# Patient Record
Sex: Male | Born: 1935 | Race: White | Hispanic: No | Smoking: Former smoker
Health system: Southern US, Community
[De-identification: ages and names within clinical notes are randomized; demographics above are authoritative.]

## PROBLEM LIST (undated history)

## (undated) DIAGNOSIS — K219 Gastro-esophageal reflux disease without esophagitis: Secondary | ICD-10-CM

## (undated) DIAGNOSIS — I350 Nonrheumatic aortic (valve) stenosis: Secondary | ICD-10-CM

## (undated) DIAGNOSIS — G629 Polyneuropathy, unspecified: Secondary | ICD-10-CM

## (undated) DIAGNOSIS — E119 Type 2 diabetes mellitus without complications: Secondary | ICD-10-CM

## (undated) DIAGNOSIS — Z974 Presence of external hearing-aid: Secondary | ICD-10-CM

## (undated) DIAGNOSIS — I2510 Atherosclerotic heart disease of native coronary artery without angina pectoris: Secondary | ICD-10-CM

## (undated) DIAGNOSIS — I219 Acute myocardial infarction, unspecified: Secondary | ICD-10-CM

## (undated) DIAGNOSIS — I10 Essential (primary) hypertension: Secondary | ICD-10-CM

## (undated) DIAGNOSIS — I6529 Occlusion and stenosis of unspecified carotid artery: Secondary | ICD-10-CM

---

## 2005-05-05 ENCOUNTER — Ambulatory Visit: Payer: Self-pay | Admitting: Unknown Physician Specialty

## 2010-08-20 ENCOUNTER — Ambulatory Visit: Payer: Self-pay | Admitting: Unknown Physician Specialty

## 2010-12-14 ENCOUNTER — Ambulatory Visit: Payer: Self-pay | Admitting: Internal Medicine

## 2010-12-25 ENCOUNTER — Ambulatory Visit: Payer: Self-pay | Admitting: Internal Medicine

## 2011-01-13 ENCOUNTER — Ambulatory Visit: Payer: Self-pay | Admitting: Internal Medicine

## 2012-01-19 ENCOUNTER — Ambulatory Visit: Payer: Self-pay | Admitting: Internal Medicine

## 2012-01-20 LAB — BASIC METABOLIC PANEL
BUN: 13 mg/dL (ref 7–18)
Calcium, Total: 8.4 mg/dL — ABNORMAL LOW (ref 8.5–10.1)
Co2: 27 mmol/L (ref 21–32)
Creatinine: 0.92 mg/dL (ref 0.60–1.30)
EGFR (Non-African Amer.): >60
Glucose: 135 mg/dL — ABNORMAL HIGH (ref 65–99)
Potassium: 4.2 mmol/L (ref 3.5–5.1)
Sodium: 141 mmol/L (ref 136–145)

## 2012-01-20 LAB — CK TOTAL AND CKMB (NOT AT ARMC)
CK, Total: 35 U/L (ref 35–232)
CK-MB: 2.7 ng/mL (ref 0.5–3.6)

## 2014-06-09 DIAGNOSIS — G63 Polyneuropathy in diseases classified elsewhere: Secondary | ICD-10-CM | POA: Diagnosis not present

## 2014-06-09 DIAGNOSIS — I10 Essential (primary) hypertension: Secondary | ICD-10-CM | POA: Diagnosis not present

## 2014-06-09 DIAGNOSIS — N401 Enlarged prostate with lower urinary tract symptoms: Secondary | ICD-10-CM | POA: Diagnosis not present

## 2014-06-09 DIAGNOSIS — Z79899 Other long term (current) drug therapy: Secondary | ICD-10-CM | POA: Diagnosis not present

## 2014-06-09 DIAGNOSIS — E1142 Type 2 diabetes mellitus with diabetic polyneuropathy: Secondary | ICD-10-CM | POA: Diagnosis not present

## 2014-06-09 DIAGNOSIS — D61818 Other pancytopenia: Secondary | ICD-10-CM | POA: Diagnosis not present

## 2014-06-09 DIAGNOSIS — E780 Pure hypercholesterolemia: Secondary | ICD-10-CM | POA: Diagnosis not present

## 2014-12-07 DIAGNOSIS — D61818 Other pancytopenia: Secondary | ICD-10-CM | POA: Diagnosis not present

## 2014-12-07 DIAGNOSIS — E1140 Type 2 diabetes mellitus with diabetic neuropathy, unspecified: Secondary | ICD-10-CM | POA: Diagnosis not present

## 2014-12-07 DIAGNOSIS — Z79899 Other long term (current) drug therapy: Secondary | ICD-10-CM | POA: Diagnosis not present

## 2014-12-07 DIAGNOSIS — E780 Pure hypercholesterolemia: Secondary | ICD-10-CM | POA: Diagnosis not present

## 2015-02-07 DIAGNOSIS — Z23 Encounter for immunization: Secondary | ICD-10-CM | POA: Diagnosis not present

## 2015-03-16 DIAGNOSIS — E11319 Type 2 diabetes mellitus with unspecified diabetic retinopathy without macular edema: Secondary | ICD-10-CM | POA: Diagnosis not present

## 2015-06-11 DIAGNOSIS — K219 Gastro-esophageal reflux disease without esophagitis: Secondary | ICD-10-CM | POA: Diagnosis not present

## 2015-06-11 DIAGNOSIS — Z79899 Other long term (current) drug therapy: Secondary | ICD-10-CM | POA: Diagnosis not present

## 2015-06-11 DIAGNOSIS — R252 Cramp and spasm: Secondary | ICD-10-CM | POA: Diagnosis not present

## 2015-06-11 DIAGNOSIS — G63 Polyneuropathy in diseases classified elsewhere: Secondary | ICD-10-CM | POA: Diagnosis not present

## 2015-06-11 DIAGNOSIS — I10 Essential (primary) hypertension: Secondary | ICD-10-CM | POA: Diagnosis not present

## 2015-06-11 DIAGNOSIS — E1140 Type 2 diabetes mellitus with diabetic neuropathy, unspecified: Secondary | ICD-10-CM | POA: Diagnosis not present

## 2015-06-11 DIAGNOSIS — E7800 Pure hypercholesterolemia, unspecified: Secondary | ICD-10-CM | POA: Diagnosis not present

## 2015-06-11 DIAGNOSIS — D61818 Other pancytopenia: Secondary | ICD-10-CM | POA: Diagnosis not present

## 2015-08-13 DIAGNOSIS — E7800 Pure hypercholesterolemia, unspecified: Secondary | ICD-10-CM | POA: Diagnosis not present

## 2015-08-13 DIAGNOSIS — E1140 Type 2 diabetes mellitus with diabetic neuropathy, unspecified: Secondary | ICD-10-CM | POA: Diagnosis not present

## 2015-08-13 DIAGNOSIS — R252 Cramp and spasm: Secondary | ICD-10-CM | POA: Diagnosis not present

## 2015-08-13 DIAGNOSIS — N401 Enlarged prostate with lower urinary tract symptoms: Secondary | ICD-10-CM | POA: Diagnosis not present

## 2015-08-13 DIAGNOSIS — E559 Vitamin D deficiency, unspecified: Secondary | ICD-10-CM | POA: Diagnosis not present

## 2015-08-13 DIAGNOSIS — I10 Essential (primary) hypertension: Secondary | ICD-10-CM | POA: Diagnosis not present

## 2015-12-07 DIAGNOSIS — G63 Polyneuropathy in diseases classified elsewhere: Secondary | ICD-10-CM | POA: Diagnosis not present

## 2015-12-07 DIAGNOSIS — E1140 Type 2 diabetes mellitus with diabetic neuropathy, unspecified: Secondary | ICD-10-CM | POA: Diagnosis not present

## 2015-12-07 DIAGNOSIS — K219 Gastro-esophageal reflux disease without esophagitis: Secondary | ICD-10-CM | POA: Diagnosis not present

## 2015-12-07 DIAGNOSIS — E7800 Pure hypercholesterolemia, unspecified: Secondary | ICD-10-CM | POA: Diagnosis not present

## 2015-12-07 DIAGNOSIS — M791 Myalgia: Secondary | ICD-10-CM | POA: Diagnosis not present

## 2015-12-07 DIAGNOSIS — I10 Essential (primary) hypertension: Secondary | ICD-10-CM | POA: Diagnosis not present

## 2015-12-07 DIAGNOSIS — D61818 Other pancytopenia: Secondary | ICD-10-CM | POA: Diagnosis not present

## 2015-12-07 DIAGNOSIS — E559 Vitamin D deficiency, unspecified: Secondary | ICD-10-CM | POA: Diagnosis not present

## 2015-12-07 DIAGNOSIS — Z79899 Other long term (current) drug therapy: Secondary | ICD-10-CM | POA: Diagnosis not present

## 2016-01-14 DIAGNOSIS — Z23 Encounter for immunization: Secondary | ICD-10-CM | POA: Diagnosis not present

## 2016-06-10 DIAGNOSIS — I10 Essential (primary) hypertension: Secondary | ICD-10-CM | POA: Diagnosis not present

## 2016-06-10 DIAGNOSIS — I2510 Atherosclerotic heart disease of native coronary artery without angina pectoris: Secondary | ICD-10-CM | POA: Diagnosis not present

## 2016-06-10 DIAGNOSIS — E1140 Type 2 diabetes mellitus with diabetic neuropathy, unspecified: Secondary | ICD-10-CM | POA: Diagnosis not present

## 2016-06-10 DIAGNOSIS — Z79899 Other long term (current) drug therapy: Secondary | ICD-10-CM | POA: Diagnosis not present

## 2016-06-10 DIAGNOSIS — Z23 Encounter for immunization: Secondary | ICD-10-CM | POA: Diagnosis not present

## 2016-06-10 DIAGNOSIS — K219 Gastro-esophageal reflux disease without esophagitis: Secondary | ICD-10-CM | POA: Diagnosis not present

## 2016-06-10 DIAGNOSIS — E7800 Pure hypercholesterolemia, unspecified: Secondary | ICD-10-CM | POA: Diagnosis not present

## 2016-06-10 DIAGNOSIS — D61818 Other pancytopenia: Secondary | ICD-10-CM | POA: Diagnosis not present

## 2016-07-18 DIAGNOSIS — E119 Type 2 diabetes mellitus without complications: Secondary | ICD-10-CM | POA: Diagnosis not present

## 2016-12-08 DIAGNOSIS — Z79899 Other long term (current) drug therapy: Secondary | ICD-10-CM | POA: Diagnosis not present

## 2016-12-08 DIAGNOSIS — D61818 Other pancytopenia: Secondary | ICD-10-CM | POA: Diagnosis not present

## 2016-12-08 DIAGNOSIS — E7800 Pure hypercholesterolemia, unspecified: Secondary | ICD-10-CM | POA: Diagnosis not present

## 2016-12-08 DIAGNOSIS — N401 Enlarged prostate with lower urinary tract symptoms: Secondary | ICD-10-CM | POA: Diagnosis not present

## 2016-12-08 DIAGNOSIS — E559 Vitamin D deficiency, unspecified: Secondary | ICD-10-CM | POA: Diagnosis not present

## 2016-12-08 DIAGNOSIS — E538 Deficiency of other specified B group vitamins: Secondary | ICD-10-CM | POA: Diagnosis not present

## 2016-12-08 DIAGNOSIS — E1140 Type 2 diabetes mellitus with diabetic neuropathy, unspecified: Secondary | ICD-10-CM | POA: Diagnosis not present

## 2016-12-08 DIAGNOSIS — K219 Gastro-esophageal reflux disease without esophagitis: Secondary | ICD-10-CM | POA: Diagnosis not present

## 2016-12-08 DIAGNOSIS — G63 Polyneuropathy in diseases classified elsewhere: Secondary | ICD-10-CM | POA: Diagnosis not present

## 2017-01-28 DIAGNOSIS — B350 Tinea barbae and tinea capitis: Secondary | ICD-10-CM | POA: Diagnosis not present

## 2017-06-10 DIAGNOSIS — E1140 Type 2 diabetes mellitus with diabetic neuropathy, unspecified: Secondary | ICD-10-CM | POA: Diagnosis not present

## 2017-06-10 DIAGNOSIS — E559 Vitamin D deficiency, unspecified: Secondary | ICD-10-CM | POA: Diagnosis not present

## 2017-06-10 DIAGNOSIS — E7800 Pure hypercholesterolemia, unspecified: Secondary | ICD-10-CM | POA: Diagnosis not present

## 2017-06-10 DIAGNOSIS — I25119 Atherosclerotic heart disease of native coronary artery with unspecified angina pectoris: Secondary | ICD-10-CM | POA: Diagnosis not present

## 2017-06-10 DIAGNOSIS — E538 Deficiency of other specified B group vitamins: Secondary | ICD-10-CM | POA: Diagnosis not present

## 2017-06-10 DIAGNOSIS — Z79899 Other long term (current) drug therapy: Secondary | ICD-10-CM | POA: Diagnosis not present

## 2017-06-10 DIAGNOSIS — Z0000 Encounter for general adult medical examination without abnormal findings: Secondary | ICD-10-CM | POA: Diagnosis not present

## 2017-06-10 DIAGNOSIS — I10 Essential (primary) hypertension: Secondary | ICD-10-CM | POA: Diagnosis not present

## 2017-06-10 DIAGNOSIS — D61818 Other pancytopenia: Secondary | ICD-10-CM | POA: Diagnosis not present

## 2017-06-16 DIAGNOSIS — Z7689 Persons encountering health services in other specified circumstances: Secondary | ICD-10-CM | POA: Diagnosis not present

## 2017-06-16 DIAGNOSIS — E7800 Pure hypercholesterolemia, unspecified: Secondary | ICD-10-CM | POA: Diagnosis not present

## 2017-06-16 DIAGNOSIS — I10 Essential (primary) hypertension: Secondary | ICD-10-CM | POA: Diagnosis not present

## 2017-06-16 DIAGNOSIS — I25119 Atherosclerotic heart disease of native coronary artery with unspecified angina pectoris: Secondary | ICD-10-CM | POA: Diagnosis not present

## 2017-06-22 DIAGNOSIS — I208 Other forms of angina pectoris: Secondary | ICD-10-CM | POA: Diagnosis present

## 2017-06-25 ENCOUNTER — Encounter: Payer: Self-pay | Admitting: Emergency Medicine

## 2017-06-25 ENCOUNTER — Encounter
Admission: RE | Disposition: A | Discharge status: Home / Self Care | Payer: Self-pay | Source: Ambulatory Visit | Attending: Internal Medicine

## 2017-06-25 ENCOUNTER — Ambulatory Visit
Admission: RE | Admit: 2017-06-25 | Discharge: 2017-06-25 | Disposition: A | Discharge status: Home / Self Care | Payer: Medicare HMO | Source: Ambulatory Visit | Attending: Internal Medicine | Admitting: Internal Medicine

## 2017-06-25 DIAGNOSIS — Z87891 Personal history of nicotine dependence: Secondary | ICD-10-CM | POA: Diagnosis not present

## 2017-06-25 DIAGNOSIS — Z7984 Long term (current) use of oral hypoglycemic drugs: Secondary | ICD-10-CM | POA: Diagnosis not present

## 2017-06-25 DIAGNOSIS — Z7982 Long term (current) use of aspirin: Secondary | ICD-10-CM | POA: Diagnosis not present

## 2017-06-25 DIAGNOSIS — R079 Chest pain, unspecified: Secondary | ICD-10-CM | POA: Diagnosis present

## 2017-06-25 DIAGNOSIS — I208 Other forms of angina pectoris: Secondary | ICD-10-CM | POA: Diagnosis present

## 2017-06-25 DIAGNOSIS — Z955 Presence of coronary angioplasty implant and graft: Secondary | ICD-10-CM | POA: Diagnosis not present

## 2017-06-25 DIAGNOSIS — I25110 Atherosclerotic heart disease of native coronary artery with unstable angina pectoris: Secondary | ICD-10-CM | POA: Diagnosis not present

## 2017-06-25 DIAGNOSIS — I25119 Atherosclerotic heart disease of native coronary artery with unspecified angina pectoris: Secondary | ICD-10-CM | POA: Diagnosis not present

## 2017-06-25 DIAGNOSIS — I252 Old myocardial infarction: Secondary | ICD-10-CM | POA: Diagnosis not present

## 2017-06-25 DIAGNOSIS — I10 Essential (primary) hypertension: Secondary | ICD-10-CM | POA: Insufficient documentation

## 2017-06-25 DIAGNOSIS — K219 Gastro-esophageal reflux disease without esophagitis: Secondary | ICD-10-CM | POA: Insufficient documentation

## 2017-06-25 DIAGNOSIS — E1142 Type 2 diabetes mellitus with diabetic polyneuropathy: Secondary | ICD-10-CM | POA: Diagnosis not present

## 2017-06-25 DIAGNOSIS — E7800 Pure hypercholesterolemia, unspecified: Secondary | ICD-10-CM | POA: Diagnosis not present

## 2017-06-25 HISTORY — DX: Gastro-esophageal reflux disease without esophagitis: K21.9

## 2017-06-25 HISTORY — PX: LEFT HEART CATH AND CORONARY ANGIOGRAPHY: CATH118249

## 2017-06-25 HISTORY — DX: Acute myocardial infarction, unspecified: I21.9

## 2017-06-25 HISTORY — DX: Essential (primary) hypertension: I10

## 2017-06-25 HISTORY — DX: Type 2 diabetes mellitus without complications: E11.9

## 2017-06-25 HISTORY — DX: Atherosclerotic heart disease of native coronary artery without angina pectoris: I25.10

## 2017-06-25 LAB — BASIC METABOLIC PANEL
ANION GAP: 5 (ref 5–15)
BUN: 17 mg/dL (ref 6–20)
CHLORIDE: 107 mmol/L (ref 101–111)
CO2: 27 mmol/L (ref 22–32)
Calcium: 8.7 mg/dL — ABNORMAL LOW (ref 8.9–10.3)
Creatinine, Ser: 1 mg/dL (ref 0.61–1.24)
GFR calc Af Amer: >60 mL/min (ref >60)
GFR calc non Af Amer: >60 mL/min (ref >60)
GLUCOSE: 197 mg/dL — AB (ref 65–99)
POTASSIUM: 4.5 mmol/L (ref 3.5–5.1)
Sodium: 139 mmol/L (ref 135–145)

## 2017-06-25 LAB — GLUCOSE, CAPILLARY
GLUCOSE-CAPILLARY: 177 mg/dL — AB (ref 65–99)
Glucose-Capillary: 172 mg/dL — ABNORMAL HIGH (ref 65–99)

## 2017-06-25 LAB — CBC
HCT: 34.8 % — ABNORMAL LOW (ref 40.0–52.0)
HEMOGLOBIN: 12.1 g/dL — AB (ref 13.0–18.0)
MCH: 30.2 pg (ref 26.0–34.0)
MCHC: 34.8 g/dL (ref 32.0–36.0)
MCV: 86.7 fL (ref 80.0–100.0)
PLATELETS: 165 10*3/uL (ref 150–440)
RBC: 4.01 MIL/uL — AB (ref 4.40–5.90)
RDW: 14.2 % (ref 11.5–14.5)
WBC: 2.8 10*3/uL — AB (ref 3.8–10.6)

## 2017-06-25 SURGERY — LEFT HEART CATH AND CORONARY ANGIOGRAPHY
Anesthesia: Moderate Sedation | Laterality: Left

## 2017-06-25 MED ORDER — ACETAMINOPHEN 325 MG PO TABS: 650.0000 mg | ORAL_TABLET | ORAL | Status: DC | PRN

## 2017-06-25 MED ORDER — SODIUM CHLORIDE 0.9 % WEIGHT BASED INFUSION: 1.0000 mL/kg/h | INTRAVENOUS | Status: DC

## 2017-06-25 MED ORDER — SODIUM CHLORIDE 0.9 % WEIGHT BASED INFUSION
251.7000 mL/h | INTRAVENOUS | Status: AC
  Administered 2017-06-25: 3 mL/kg/h via INTRAVENOUS

## 2017-06-25 MED ORDER — MIDAZOLAM HCL 2 MG/2ML IJ SOLN
INTRAMUSCULAR | Status: AC
  Filled 2017-06-25: qty 2

## 2017-06-25 MED ORDER — MIDAZOLAM HCL 2 MG/2ML IJ SOLN
INTRAMUSCULAR | Status: DC | PRN
  Administered 2017-06-25: 1 mg via INTRAVENOUS

## 2017-06-25 MED ORDER — SODIUM CHLORIDE 0.9% FLUSH: 3.0000 mL | INTRAVENOUS | Status: DC | PRN

## 2017-06-25 MED ORDER — SODIUM CHLORIDE 0.9 % IV SOLN: 250.0000 mL | INTRAVENOUS | Status: DC | PRN

## 2017-06-25 MED ORDER — ONDANSETRON HCL 4 MG/2ML IJ SOLN: 4.0000 mg | Freq: Four times a day (QID) | INTRAMUSCULAR | Status: DC | PRN

## 2017-06-25 MED ORDER — ASPIRIN 81 MG PO CHEW
81.0000 mg | CHEWABLE_TABLET | ORAL | Status: AC
  Administered 2017-06-25: 81 mg via ORAL

## 2017-06-25 MED ORDER — ASPIRIN 81 MG PO CHEW
CHEWABLE_TABLET | ORAL | Status: AC
  Filled 2017-06-25: qty 1

## 2017-06-25 MED ORDER — IOPAMIDOL (ISOVUE-300) INJECTION 61%
INTRAVENOUS | Status: DC | PRN
  Administered 2017-06-25: 95 mL via INTRA_ARTERIAL

## 2017-06-25 MED ORDER — FENTANYL CITRATE (PF) 100 MCG/2ML IJ SOLN
INTRAMUSCULAR | Status: AC
  Filled 2017-06-25: qty 2

## 2017-06-25 MED ORDER — FENTANYL CITRATE (PF) 100 MCG/2ML IJ SOLN
INTRAMUSCULAR | Status: DC | PRN
  Administered 2017-06-25: 25 ug via INTRAVENOUS

## 2017-06-25 MED ORDER — SODIUM CHLORIDE 0.9% FLUSH: 3.0000 mL | Freq: Two times a day (BID) | INTRAVENOUS | Status: DC

## 2017-06-25 MED ORDER — HEPARIN (PORCINE) IN NACL 2-0.9 UNIT/ML-% IJ SOLN
INTRAMUSCULAR | Status: AC
  Filled 2017-06-25: qty 500

## 2017-06-25 SURGICAL SUPPLY — 9 items
CATH INFINITI 5FR ANG PIGTAIL (CATHETERS) ×3 IMPLANT
CATH INFINITI 5FR JL4 (CATHETERS) ×3 IMPLANT
CATH INFINITI JR4 5F (CATHETERS) ×3 IMPLANT
DEVICE CLOSURE MYNXGRIP 5F (Vascular Products) ×3 IMPLANT
KIT MANI 3VAL PERCEP (MISCELLANEOUS) ×3 IMPLANT
NEEDLE PERC 18GX7CM (NEEDLE) ×3 IMPLANT
PACK CARDIAC CATH (CUSTOM PROCEDURE TRAY) ×3 IMPLANT
SHEATH PINNACLE 5F 10CM (SHEATH) ×3 IMPLANT
WIRE GUIDERIGHT .035X150 (WIRE) ×3 IMPLANT

## 2017-06-29 DIAGNOSIS — E1140 Type 2 diabetes mellitus with diabetic neuropathy, unspecified: Secondary | ICD-10-CM | POA: Diagnosis not present

## 2017-07-14 DIAGNOSIS — I10 Essential (primary) hypertension: Secondary | ICD-10-CM | POA: Diagnosis not present

## 2017-07-14 DIAGNOSIS — I25119 Atherosclerotic heart disease of native coronary artery with unspecified angina pectoris: Secondary | ICD-10-CM | POA: Diagnosis not present

## 2017-07-14 DIAGNOSIS — E7800 Pure hypercholesterolemia, unspecified: Secondary | ICD-10-CM | POA: Diagnosis not present

## 2017-08-20 DIAGNOSIS — E1140 Type 2 diabetes mellitus with diabetic neuropathy, unspecified: Secondary | ICD-10-CM | POA: Diagnosis not present

## 2017-11-09 DIAGNOSIS — Z79899 Other long term (current) drug therapy: Secondary | ICD-10-CM | POA: Diagnosis not present

## 2017-11-09 DIAGNOSIS — E559 Vitamin D deficiency, unspecified: Secondary | ICD-10-CM | POA: Diagnosis not present

## 2017-11-09 DIAGNOSIS — I10 Essential (primary) hypertension: Secondary | ICD-10-CM | POA: Diagnosis not present

## 2017-11-09 DIAGNOSIS — D61818 Other pancytopenia: Secondary | ICD-10-CM | POA: Diagnosis not present

## 2017-11-09 DIAGNOSIS — E7800 Pure hypercholesterolemia, unspecified: Secondary | ICD-10-CM | POA: Diagnosis not present

## 2017-11-09 DIAGNOSIS — N401 Enlarged prostate with lower urinary tract symptoms: Secondary | ICD-10-CM | POA: Diagnosis not present

## 2017-11-09 DIAGNOSIS — E538 Deficiency of other specified B group vitamins: Secondary | ICD-10-CM | POA: Diagnosis not present

## 2017-11-09 DIAGNOSIS — E1140 Type 2 diabetes mellitus with diabetic neuropathy, unspecified: Secondary | ICD-10-CM | POA: Diagnosis not present

## 2017-11-09 DIAGNOSIS — I25119 Atherosclerotic heart disease of native coronary artery with unspecified angina pectoris: Secondary | ICD-10-CM | POA: Diagnosis not present

## 2017-11-18 DIAGNOSIS — R0989 Other specified symptoms and signs involving the circulatory and respiratory systems: Secondary | ICD-10-CM | POA: Diagnosis not present

## 2017-11-18 DIAGNOSIS — E1140 Type 2 diabetes mellitus with diabetic neuropathy, unspecified: Secondary | ICD-10-CM | POA: Diagnosis not present

## 2017-11-18 DIAGNOSIS — I10 Essential (primary) hypertension: Secondary | ICD-10-CM | POA: Diagnosis not present

## 2017-11-18 DIAGNOSIS — K219 Gastro-esophageal reflux disease without esophagitis: Secondary | ICD-10-CM | POA: Diagnosis not present

## 2017-11-18 DIAGNOSIS — I25119 Atherosclerotic heart disease of native coronary artery with unspecified angina pectoris: Secondary | ICD-10-CM | POA: Diagnosis not present

## 2017-11-18 DIAGNOSIS — I38 Endocarditis, valve unspecified: Secondary | ICD-10-CM | POA: Diagnosis not present

## 2017-11-18 DIAGNOSIS — R0609 Other forms of dyspnea: Secondary | ICD-10-CM | POA: Diagnosis not present

## 2017-11-18 DIAGNOSIS — E7800 Pure hypercholesterolemia, unspecified: Secondary | ICD-10-CM | POA: Diagnosis not present

## 2017-12-08 DIAGNOSIS — R0609 Other forms of dyspnea: Secondary | ICD-10-CM | POA: Diagnosis not present

## 2017-12-08 DIAGNOSIS — I25118 Atherosclerotic heart disease of native coronary artery with other forms of angina pectoris: Secondary | ICD-10-CM | POA: Diagnosis not present

## 2017-12-08 DIAGNOSIS — E1140 Type 2 diabetes mellitus with diabetic neuropathy, unspecified: Secondary | ICD-10-CM | POA: Diagnosis not present

## 2017-12-08 DIAGNOSIS — I25119 Atherosclerotic heart disease of native coronary artery with unspecified angina pectoris: Secondary | ICD-10-CM | POA: Diagnosis not present

## 2017-12-08 DIAGNOSIS — K219 Gastro-esophageal reflux disease without esophagitis: Secondary | ICD-10-CM | POA: Diagnosis not present

## 2017-12-08 DIAGNOSIS — I38 Endocarditis, valve unspecified: Secondary | ICD-10-CM | POA: Diagnosis not present

## 2017-12-08 DIAGNOSIS — I10 Essential (primary) hypertension: Secondary | ICD-10-CM | POA: Diagnosis not present

## 2017-12-08 DIAGNOSIS — I6523 Occlusion and stenosis of bilateral carotid arteries: Secondary | ICD-10-CM | POA: Diagnosis not present

## 2018-01-01 DIAGNOSIS — E11319 Type 2 diabetes mellitus with unspecified diabetic retinopathy without macular edema: Secondary | ICD-10-CM | POA: Diagnosis not present

## 2018-02-09 DIAGNOSIS — I350 Nonrheumatic aortic (valve) stenosis: Secondary | ICD-10-CM | POA: Diagnosis not present

## 2018-02-09 DIAGNOSIS — I6523 Occlusion and stenosis of bilateral carotid arteries: Secondary | ICD-10-CM | POA: Diagnosis not present

## 2018-02-09 DIAGNOSIS — I10 Essential (primary) hypertension: Secondary | ICD-10-CM | POA: Diagnosis not present

## 2018-02-09 DIAGNOSIS — R001 Bradycardia, unspecified: Secondary | ICD-10-CM | POA: Diagnosis not present

## 2018-02-09 DIAGNOSIS — I25119 Atherosclerotic heart disease of native coronary artery with unspecified angina pectoris: Secondary | ICD-10-CM | POA: Diagnosis not present

## 2018-05-19 DIAGNOSIS — I25119 Atherosclerotic heart disease of native coronary artery with unspecified angina pectoris: Secondary | ICD-10-CM | POA: Diagnosis not present

## 2018-05-19 DIAGNOSIS — E7800 Pure hypercholesterolemia, unspecified: Secondary | ICD-10-CM | POA: Diagnosis not present

## 2018-05-19 DIAGNOSIS — E559 Vitamin D deficiency, unspecified: Secondary | ICD-10-CM | POA: Diagnosis not present

## 2018-05-19 DIAGNOSIS — Z79899 Other long term (current) drug therapy: Secondary | ICD-10-CM | POA: Diagnosis not present

## 2018-05-19 DIAGNOSIS — D61818 Other pancytopenia: Secondary | ICD-10-CM | POA: Diagnosis not present

## 2018-05-19 DIAGNOSIS — E1159 Type 2 diabetes mellitus with other circulatory complications: Secondary | ICD-10-CM | POA: Diagnosis not present

## 2018-05-19 DIAGNOSIS — E1140 Type 2 diabetes mellitus with diabetic neuropathy, unspecified: Secondary | ICD-10-CM | POA: Diagnosis not present

## 2018-05-19 DIAGNOSIS — N401 Enlarged prostate with lower urinary tract symptoms: Secondary | ICD-10-CM | POA: Diagnosis not present

## 2018-08-12 DIAGNOSIS — I10 Essential (primary) hypertension: Secondary | ICD-10-CM | POA: Diagnosis not present

## 2018-08-12 DIAGNOSIS — I350 Nonrheumatic aortic (valve) stenosis: Secondary | ICD-10-CM | POA: Diagnosis not present

## 2018-08-12 DIAGNOSIS — I6523 Occlusion and stenosis of bilateral carotid arteries: Secondary | ICD-10-CM | POA: Diagnosis not present

## 2018-08-12 DIAGNOSIS — E7800 Pure hypercholesterolemia, unspecified: Secondary | ICD-10-CM | POA: Diagnosis not present

## 2018-08-12 DIAGNOSIS — I25119 Atherosclerotic heart disease of native coronary artery with unspecified angina pectoris: Secondary | ICD-10-CM | POA: Diagnosis not present

## 2018-08-12 DIAGNOSIS — E1159 Type 2 diabetes mellitus with other circulatory complications: Secondary | ICD-10-CM | POA: Diagnosis not present

## 2018-11-10 DIAGNOSIS — E7800 Pure hypercholesterolemia, unspecified: Secondary | ICD-10-CM | POA: Diagnosis not present

## 2018-11-10 DIAGNOSIS — E559 Vitamin D deficiency, unspecified: Secondary | ICD-10-CM | POA: Diagnosis not present

## 2018-11-10 DIAGNOSIS — E1159 Type 2 diabetes mellitus with other circulatory complications: Secondary | ICD-10-CM | POA: Diagnosis not present

## 2018-11-10 DIAGNOSIS — Z0000 Encounter for general adult medical examination without abnormal findings: Secondary | ICD-10-CM | POA: Diagnosis not present

## 2018-11-10 DIAGNOSIS — E538 Deficiency of other specified B group vitamins: Secondary | ICD-10-CM | POA: Diagnosis not present

## 2018-11-10 DIAGNOSIS — Z79899 Other long term (current) drug therapy: Secondary | ICD-10-CM | POA: Diagnosis not present

## 2018-11-10 DIAGNOSIS — E1140 Type 2 diabetes mellitus with diabetic neuropathy, unspecified: Secondary | ICD-10-CM | POA: Diagnosis not present

## 2018-11-10 DIAGNOSIS — D61818 Other pancytopenia: Secondary | ICD-10-CM | POA: Diagnosis not present

## 2018-11-10 DIAGNOSIS — N401 Enlarged prostate with lower urinary tract symptoms: Secondary | ICD-10-CM | POA: Diagnosis not present

## 2018-12-31 DIAGNOSIS — E11319 Type 2 diabetes mellitus with unspecified diabetic retinopathy without macular edema: Secondary | ICD-10-CM | POA: Diagnosis not present

## 2019-01-25 DIAGNOSIS — I350 Nonrheumatic aortic (valve) stenosis: Secondary | ICD-10-CM | POA: Diagnosis not present

## 2019-01-25 DIAGNOSIS — I6523 Occlusion and stenosis of bilateral carotid arteries: Secondary | ICD-10-CM | POA: Diagnosis not present

## 2019-01-25 DIAGNOSIS — R0600 Dyspnea, unspecified: Secondary | ICD-10-CM | POA: Diagnosis not present

## 2019-01-25 DIAGNOSIS — I25119 Atherosclerotic heart disease of native coronary artery with unspecified angina pectoris: Secondary | ICD-10-CM | POA: Diagnosis not present

## 2019-01-25 DIAGNOSIS — I10 Essential (primary) hypertension: Secondary | ICD-10-CM | POA: Diagnosis not present

## 2019-01-25 DIAGNOSIS — E1159 Type 2 diabetes mellitus with other circulatory complications: Secondary | ICD-10-CM | POA: Diagnosis not present

## 2019-01-25 DIAGNOSIS — R079 Chest pain, unspecified: Secondary | ICD-10-CM | POA: Diagnosis not present

## 2019-02-10 DIAGNOSIS — I25119 Atherosclerotic heart disease of native coronary artery with unspecified angina pectoris: Secondary | ICD-10-CM | POA: Diagnosis not present

## 2019-02-10 DIAGNOSIS — R0600 Dyspnea, unspecified: Secondary | ICD-10-CM | POA: Diagnosis not present

## 2019-02-10 DIAGNOSIS — I350 Nonrheumatic aortic (valve) stenosis: Secondary | ICD-10-CM | POA: Diagnosis not present

## 2019-02-10 DIAGNOSIS — E7800 Pure hypercholesterolemia, unspecified: Secondary | ICD-10-CM | POA: Diagnosis not present

## 2019-02-10 DIAGNOSIS — I6523 Occlusion and stenosis of bilateral carotid arteries: Secondary | ICD-10-CM | POA: Diagnosis not present

## 2019-02-10 DIAGNOSIS — R079 Chest pain, unspecified: Secondary | ICD-10-CM | POA: Diagnosis not present

## 2019-02-10 DIAGNOSIS — I441 Atrioventricular block, second degree: Secondary | ICD-10-CM | POA: Diagnosis not present

## 2019-02-22 DIAGNOSIS — H6011 Cellulitis of right external ear: Secondary | ICD-10-CM | POA: Diagnosis not present

## 2019-02-22 DIAGNOSIS — H6123 Impacted cerumen, bilateral: Secondary | ICD-10-CM | POA: Diagnosis not present

## 2019-03-07 DIAGNOSIS — H6122 Impacted cerumen, left ear: Secondary | ICD-10-CM | POA: Diagnosis not present

## 2019-03-07 DIAGNOSIS — H6010 Cellulitis of external ear, unspecified ear: Secondary | ICD-10-CM | POA: Diagnosis not present

## 2019-03-07 DIAGNOSIS — H903 Sensorineural hearing loss, bilateral: Secondary | ICD-10-CM | POA: Diagnosis not present

## 2019-03-09 DIAGNOSIS — I441 Atrioventricular block, second degree: Secondary | ICD-10-CM | POA: Diagnosis not present

## 2019-03-09 DIAGNOSIS — I350 Nonrheumatic aortic (valve) stenosis: Secondary | ICD-10-CM | POA: Diagnosis not present

## 2019-03-09 DIAGNOSIS — I25119 Atherosclerotic heart disease of native coronary artery with unspecified angina pectoris: Secondary | ICD-10-CM | POA: Diagnosis not present

## 2019-03-09 DIAGNOSIS — I6523 Occlusion and stenosis of bilateral carotid arteries: Secondary | ICD-10-CM | POA: Diagnosis not present

## 2019-05-18 DIAGNOSIS — Z79899 Other long term (current) drug therapy: Secondary | ICD-10-CM | POA: Diagnosis not present

## 2019-05-18 DIAGNOSIS — E1142 Type 2 diabetes mellitus with diabetic polyneuropathy: Secondary | ICD-10-CM | POA: Diagnosis not present

## 2019-05-18 DIAGNOSIS — D61818 Other pancytopenia: Secondary | ICD-10-CM | POA: Diagnosis not present

## 2019-05-18 DIAGNOSIS — I25118 Atherosclerotic heart disease of native coronary artery with other forms of angina pectoris: Secondary | ICD-10-CM | POA: Diagnosis not present

## 2019-05-18 DIAGNOSIS — E1140 Type 2 diabetes mellitus with diabetic neuropathy, unspecified: Secondary | ICD-10-CM | POA: Diagnosis not present

## 2019-05-18 DIAGNOSIS — K219 Gastro-esophageal reflux disease without esophagitis: Secondary | ICD-10-CM | POA: Diagnosis not present

## 2019-05-18 DIAGNOSIS — I10 Essential (primary) hypertension: Secondary | ICD-10-CM | POA: Diagnosis not present

## 2019-05-18 DIAGNOSIS — E1159 Type 2 diabetes mellitus with other circulatory complications: Secondary | ICD-10-CM | POA: Diagnosis not present

## 2019-05-18 DIAGNOSIS — E538 Deficiency of other specified B group vitamins: Secondary | ICD-10-CM | POA: Diagnosis not present

## 2019-05-18 DIAGNOSIS — E559 Vitamin D deficiency, unspecified: Secondary | ICD-10-CM | POA: Diagnosis not present

## 2019-05-18 DIAGNOSIS — E7800 Pure hypercholesterolemia, unspecified: Secondary | ICD-10-CM | POA: Diagnosis not present

## 2019-05-20 ENCOUNTER — Ambulatory Visit: Payer: Medicare HMO | Attending: Internal Medicine

## 2019-05-20 ENCOUNTER — Other Ambulatory Visit: Payer: Self-pay

## 2019-05-20 DIAGNOSIS — Z23 Encounter for immunization: Secondary | ICD-10-CM | POA: Insufficient documentation

## 2019-05-20 NOTE — Progress Notes (Signed)
   Covid-19 Vaccination Clinic  Name:  Eduardo Ashley    MRN: [id number] DOB: [birth year]  05/20/2019  Mr. Eduardo Ashley was observed post Covid-19 immunization for 15 minutes without incidence. He was provided with Vaccine Information Sheet and instruction to access the V-Safe system.   Mr. Eduardo Ashley was instructed to call 911 with any severe reactions post vaccine: Marland Kitchen Difficulty breathing  . Swelling of your face and throat  . A fast heartbeat  . A bad rash all over your body  . Dizziness and weakness    Immunizations Administered    Name Date Dose VIS Date Route   Moderna COVID-19 Vaccine 05/20/2019  3:04 PM 0.5 mL 03/15/2019 Intramuscular   Manufacturer: Moderna   Lot: [id number]   PlainsVO:[id number]

## 2019-06-21 ENCOUNTER — Ambulatory Visit: Payer: Medicare HMO | Attending: Internal Medicine

## 2019-06-21 DIAGNOSIS — Z23 Encounter for immunization: Secondary | ICD-10-CM | POA: Insufficient documentation

## 2019-06-21 NOTE — Progress Notes (Signed)
   Covid-19 Vaccination Clinic  Name:  PAVAN STOPKA    MRN: [id number] DOB: May 15, 1930  06/21/2019  Mr. Mares was observed post Covid-19 immunization for 15 minutes without incident. He was provided with Vaccine Information Sheet and instruction to access the V-Safe system.   Mr. Sterle was instructed to call 911 with any severe reactions post vaccine: Marland Kitchen Difficulty breathing  . Swelling of face and throat  . A fast heartbeat  . A bad rash all over body  . Dizziness and weakness   Immunizations Administered    Name Date Dose VIS Date Route   Moderna COVID-19 Vaccine 06/21/2019  2:18 PM 0.5 mL 03/15/2019 Intramuscular   Manufacturer: Moderna   Lot: [id number]   [id number]

## 2019-07-27 DIAGNOSIS — I6523 Occlusion and stenosis of bilateral carotid arteries: Secondary | ICD-10-CM | POA: Diagnosis not present

## 2019-07-27 DIAGNOSIS — I25119 Atherosclerotic heart disease of native coronary artery with unspecified angina pectoris: Secondary | ICD-10-CM | POA: Diagnosis not present

## 2019-07-27 DIAGNOSIS — I10 Essential (primary) hypertension: Secondary | ICD-10-CM | POA: Diagnosis not present

## 2019-07-27 DIAGNOSIS — E1159 Type 2 diabetes mellitus with other circulatory complications: Secondary | ICD-10-CM | POA: Diagnosis not present

## 2019-07-27 DIAGNOSIS — I350 Nonrheumatic aortic (valve) stenosis: Secondary | ICD-10-CM | POA: Diagnosis not present

## 2019-11-15 DIAGNOSIS — Z0000 Encounter for general adult medical examination without abnormal findings: Secondary | ICD-10-CM | POA: Diagnosis not present

## 2019-11-15 DIAGNOSIS — R809 Proteinuria, unspecified: Secondary | ICD-10-CM | POA: Diagnosis not present

## 2019-11-15 DIAGNOSIS — E538 Deficiency of other specified B group vitamins: Secondary | ICD-10-CM | POA: Diagnosis not present

## 2019-11-15 DIAGNOSIS — E1169 Type 2 diabetes mellitus with other specified complication: Secondary | ICD-10-CM | POA: Diagnosis not present

## 2019-11-15 DIAGNOSIS — D61818 Other pancytopenia: Secondary | ICD-10-CM | POA: Diagnosis not present

## 2019-11-15 DIAGNOSIS — E1140 Type 2 diabetes mellitus with diabetic neuropathy, unspecified: Secondary | ICD-10-CM | POA: Diagnosis not present

## 2019-11-15 DIAGNOSIS — Z79899 Other long term (current) drug therapy: Secondary | ICD-10-CM | POA: Diagnosis not present

## 2019-11-15 DIAGNOSIS — E1142 Type 2 diabetes mellitus with diabetic polyneuropathy: Secondary | ICD-10-CM | POA: Diagnosis not present

## 2019-11-15 DIAGNOSIS — E1129 Type 2 diabetes mellitus with other diabetic kidney complication: Secondary | ICD-10-CM | POA: Diagnosis not present

## 2019-11-15 DIAGNOSIS — E7800 Pure hypercholesterolemia, unspecified: Secondary | ICD-10-CM | POA: Diagnosis not present

## 2019-11-15 DIAGNOSIS — R351 Nocturia: Secondary | ICD-10-CM | POA: Diagnosis not present

## 2019-11-15 DIAGNOSIS — I10 Essential (primary) hypertension: Secondary | ICD-10-CM | POA: Diagnosis not present

## 2019-11-15 DIAGNOSIS — N401 Enlarged prostate with lower urinary tract symptoms: Secondary | ICD-10-CM | POA: Diagnosis not present

## 2019-12-15 DIAGNOSIS — D485 Neoplasm of uncertain behavior of skin: Secondary | ICD-10-CM | POA: Diagnosis not present

## 2019-12-15 DIAGNOSIS — L570 Actinic keratosis: Secondary | ICD-10-CM | POA: Diagnosis not present

## 2020-01-03 DIAGNOSIS — E11319 Type 2 diabetes mellitus with unspecified diabetic retinopathy without macular edema: Secondary | ICD-10-CM | POA: Diagnosis not present

## 2020-01-12 ENCOUNTER — Other Ambulatory Visit: Payer: Medicare HMO

## 2020-01-25 DIAGNOSIS — I2510 Atherosclerotic heart disease of native coronary artery without angina pectoris: Secondary | ICD-10-CM | POA: Diagnosis not present

## 2020-01-25 DIAGNOSIS — I208 Other forms of angina pectoris: Secondary | ICD-10-CM | POA: Diagnosis not present

## 2020-01-25 DIAGNOSIS — I25119 Atherosclerotic heart disease of native coronary artery with unspecified angina pectoris: Secondary | ICD-10-CM | POA: Diagnosis not present

## 2020-01-25 DIAGNOSIS — E1159 Type 2 diabetes mellitus with other circulatory complications: Secondary | ICD-10-CM | POA: Diagnosis not present

## 2020-01-25 DIAGNOSIS — I350 Nonrheumatic aortic (valve) stenosis: Secondary | ICD-10-CM | POA: Diagnosis not present

## 2020-01-25 DIAGNOSIS — R0602 Shortness of breath: Secondary | ICD-10-CM | POA: Diagnosis not present

## 2020-01-25 DIAGNOSIS — E7800 Pure hypercholesterolemia, unspecified: Secondary | ICD-10-CM | POA: Diagnosis not present

## 2020-01-25 DIAGNOSIS — R0600 Dyspnea, unspecified: Secondary | ICD-10-CM | POA: Diagnosis not present

## 2020-01-25 DIAGNOSIS — I6523 Occlusion and stenosis of bilateral carotid arteries: Secondary | ICD-10-CM | POA: Diagnosis not present

## 2020-01-30 DIAGNOSIS — I350 Nonrheumatic aortic (valve) stenosis: Secondary | ICD-10-CM | POA: Diagnosis not present

## 2020-01-30 DIAGNOSIS — H2511 Age-related nuclear cataract, right eye: Secondary | ICD-10-CM | POA: Diagnosis not present

## 2020-02-06 ENCOUNTER — Encounter: Payer: Self-pay | Admitting: Ophthalmology

## 2020-02-06 ENCOUNTER — Encounter: Payer: Self-pay | Admitting: Anesthesiology

## 2020-02-06 ENCOUNTER — Other Ambulatory Visit: Payer: Self-pay

## 2020-02-09 ENCOUNTER — Other Ambulatory Visit: Payer: Medicare HMO

## 2020-02-13 ENCOUNTER — Ambulatory Visit: Admission: RE | Admit: 2020-02-13 | Payer: Medicare HMO | Source: Home / Self Care | Admitting: Ophthalmology

## 2020-02-13 HISTORY — DX: Occlusion and stenosis of unspecified carotid artery: I65.29

## 2020-02-13 HISTORY — DX: Presence of external hearing-aid: Z97.4

## 2020-02-13 HISTORY — DX: Nonrheumatic aortic (valve) stenosis: I35.0

## 2020-02-13 HISTORY — DX: Polyneuropathy, unspecified: G62.9

## 2020-02-13 SURGERY — PHACOEMULSIFICATION, CATARACT, WITH IOL INSERTION
Anesthesia: Topical | Laterality: Right

## 2020-03-21 DIAGNOSIS — R0600 Dyspnea, unspecified: Secondary | ICD-10-CM | POA: Diagnosis not present

## 2020-03-21 DIAGNOSIS — I2510 Atherosclerotic heart disease of native coronary artery without angina pectoris: Secondary | ICD-10-CM | POA: Diagnosis not present

## 2020-03-21 DIAGNOSIS — I708 Atherosclerosis of other arteries: Secondary | ICD-10-CM | POA: Diagnosis not present

## 2020-03-21 DIAGNOSIS — I6523 Occlusion and stenosis of bilateral carotid arteries: Secondary | ICD-10-CM | POA: Diagnosis not present

## 2020-03-21 DIAGNOSIS — I771 Stricture of artery: Secondary | ICD-10-CM | POA: Diagnosis not present

## 2020-03-21 DIAGNOSIS — R0602 Shortness of breath: Secondary | ICD-10-CM | POA: Diagnosis not present

## 2020-03-28 DIAGNOSIS — I441 Atrioventricular block, second degree: Secondary | ICD-10-CM | POA: Diagnosis not present

## 2020-03-28 DIAGNOSIS — I350 Nonrheumatic aortic (valve) stenosis: Secondary | ICD-10-CM | POA: Diagnosis not present

## 2020-03-28 DIAGNOSIS — I25119 Atherosclerotic heart disease of native coronary artery with unspecified angina pectoris: Secondary | ICD-10-CM | POA: Diagnosis not present

## 2020-03-28 DIAGNOSIS — E7800 Pure hypercholesterolemia, unspecified: Secondary | ICD-10-CM | POA: Diagnosis not present

## 2020-03-28 DIAGNOSIS — I6523 Occlusion and stenosis of bilateral carotid arteries: Secondary | ICD-10-CM | POA: Diagnosis not present

## 2020-05-17 DIAGNOSIS — I10 Essential (primary) hypertension: Secondary | ICD-10-CM | POA: Diagnosis not present

## 2020-05-17 DIAGNOSIS — E1142 Type 2 diabetes mellitus with diabetic polyneuropathy: Secondary | ICD-10-CM | POA: Diagnosis not present

## 2020-05-17 DIAGNOSIS — R252 Cramp and spasm: Secondary | ICD-10-CM | POA: Diagnosis not present

## 2020-05-17 DIAGNOSIS — E538 Deficiency of other specified B group vitamins: Secondary | ICD-10-CM | POA: Diagnosis not present

## 2020-05-17 DIAGNOSIS — E1140 Type 2 diabetes mellitus with diabetic neuropathy, unspecified: Secondary | ICD-10-CM | POA: Diagnosis not present

## 2020-05-17 DIAGNOSIS — E1169 Type 2 diabetes mellitus with other specified complication: Secondary | ICD-10-CM | POA: Diagnosis not present

## 2020-05-17 DIAGNOSIS — E1129 Type 2 diabetes mellitus with other diabetic kidney complication: Secondary | ICD-10-CM | POA: Diagnosis not present

## 2020-05-17 DIAGNOSIS — D61818 Other pancytopenia: Secondary | ICD-10-CM | POA: Diagnosis not present

## 2020-05-17 DIAGNOSIS — I25118 Atherosclerotic heart disease of native coronary artery with other forms of angina pectoris: Secondary | ICD-10-CM | POA: Diagnosis not present

## 2020-05-17 DIAGNOSIS — I6529 Occlusion and stenosis of unspecified carotid artery: Secondary | ICD-10-CM | POA: Diagnosis not present

## 2020-05-17 DIAGNOSIS — E559 Vitamin D deficiency, unspecified: Secondary | ICD-10-CM | POA: Diagnosis not present

## 2020-05-17 DIAGNOSIS — E7800 Pure hypercholesterolemia, unspecified: Secondary | ICD-10-CM | POA: Diagnosis not present

## 2020-05-17 DIAGNOSIS — I350 Nonrheumatic aortic (valve) stenosis: Secondary | ICD-10-CM | POA: Diagnosis not present

## 2020-05-17 DIAGNOSIS — Z79899 Other long term (current) drug therapy: Secondary | ICD-10-CM | POA: Diagnosis not present

## 2020-05-17 DIAGNOSIS — R809 Proteinuria, unspecified: Secondary | ICD-10-CM | POA: Diagnosis not present

## 2020-07-04 DIAGNOSIS — I25119 Atherosclerotic heart disease of native coronary artery with unspecified angina pectoris: Secondary | ICD-10-CM | POA: Diagnosis not present

## 2020-07-04 DIAGNOSIS — I350 Nonrheumatic aortic (valve) stenosis: Secondary | ICD-10-CM | POA: Diagnosis not present

## 2020-07-04 DIAGNOSIS — R001 Bradycardia, unspecified: Secondary | ICD-10-CM | POA: Diagnosis not present

## 2020-07-04 DIAGNOSIS — E7800 Pure hypercholesterolemia, unspecified: Secondary | ICD-10-CM | POA: Diagnosis not present

## 2020-07-04 DIAGNOSIS — I6523 Occlusion and stenosis of bilateral carotid arteries: Secondary | ICD-10-CM | POA: Diagnosis not present

## 2020-07-04 DIAGNOSIS — I10 Essential (primary) hypertension: Secondary | ICD-10-CM | POA: Diagnosis not present

## 2020-07-04 DIAGNOSIS — I441 Atrioventricular block, second degree: Secondary | ICD-10-CM | POA: Diagnosis not present

## 2020-07-23 DIAGNOSIS — R001 Bradycardia, unspecified: Secondary | ICD-10-CM | POA: Diagnosis not present

## 2020-08-01 DIAGNOSIS — I10 Essential (primary) hypertension: Secondary | ICD-10-CM | POA: Diagnosis not present

## 2020-08-01 DIAGNOSIS — R001 Bradycardia, unspecified: Secondary | ICD-10-CM | POA: Diagnosis not present

## 2020-08-01 DIAGNOSIS — I6523 Occlusion and stenosis of bilateral carotid arteries: Secondary | ICD-10-CM | POA: Diagnosis not present

## 2020-08-01 DIAGNOSIS — E7800 Pure hypercholesterolemia, unspecified: Secondary | ICD-10-CM | POA: Diagnosis not present

## 2020-08-01 DIAGNOSIS — I350 Nonrheumatic aortic (valve) stenosis: Secondary | ICD-10-CM | POA: Diagnosis not present

## 2020-08-01 DIAGNOSIS — I25119 Atherosclerotic heart disease of native coronary artery with unspecified angina pectoris: Secondary | ICD-10-CM | POA: Diagnosis not present

## 2020-08-01 DIAGNOSIS — I441 Atrioventricular block, second degree: Secondary | ICD-10-CM | POA: Diagnosis not present

## 2020-12-19 DIAGNOSIS — I6523 Occlusion and stenosis of bilateral carotid arteries: Secondary | ICD-10-CM | POA: Diagnosis not present

## 2020-12-19 DIAGNOSIS — I350 Nonrheumatic aortic (valve) stenosis: Secondary | ICD-10-CM | POA: Diagnosis not present

## 2020-12-19 DIAGNOSIS — E7800 Pure hypercholesterolemia, unspecified: Secondary | ICD-10-CM | POA: Diagnosis not present

## 2020-12-19 DIAGNOSIS — I25119 Atherosclerotic heart disease of native coronary artery with unspecified angina pectoris: Secondary | ICD-10-CM | POA: Diagnosis not present

## 2020-12-19 DIAGNOSIS — I10 Essential (primary) hypertension: Secondary | ICD-10-CM | POA: Diagnosis not present

## 2021-02-01 DIAGNOSIS — Z23 Encounter for immunization: Secondary | ICD-10-CM | POA: Diagnosis not present

## 2021-02-01 DIAGNOSIS — I6523 Occlusion and stenosis of bilateral carotid arteries: Secondary | ICD-10-CM | POA: Diagnosis not present

## 2021-02-01 DIAGNOSIS — I350 Nonrheumatic aortic (valve) stenosis: Secondary | ICD-10-CM | POA: Diagnosis not present

## 2021-02-01 DIAGNOSIS — I152 Hypertension secondary to endocrine disorders: Secondary | ICD-10-CM | POA: Diagnosis not present

## 2021-02-01 DIAGNOSIS — E7800 Pure hypercholesterolemia, unspecified: Secondary | ICD-10-CM | POA: Diagnosis not present

## 2021-02-01 DIAGNOSIS — N401 Enlarged prostate with lower urinary tract symptoms: Secondary | ICD-10-CM | POA: Diagnosis not present

## 2021-02-01 DIAGNOSIS — Z0000 Encounter for general adult medical examination without abnormal findings: Secondary | ICD-10-CM | POA: Diagnosis not present

## 2021-02-01 DIAGNOSIS — Z1389 Encounter for screening for other disorder: Secondary | ICD-10-CM | POA: Diagnosis not present

## 2021-02-01 DIAGNOSIS — I2510 Atherosclerotic heart disease of native coronary artery without angina pectoris: Secondary | ICD-10-CM | POA: Diagnosis not present

## 2021-02-01 DIAGNOSIS — E1159 Type 2 diabetes mellitus with other circulatory complications: Secondary | ICD-10-CM | POA: Diagnosis not present

## 2021-02-01 DIAGNOSIS — E1140 Type 2 diabetes mellitus with diabetic neuropathy, unspecified: Secondary | ICD-10-CM | POA: Diagnosis not present

## 2021-02-01 DIAGNOSIS — E1169 Type 2 diabetes mellitus with other specified complication: Secondary | ICD-10-CM | POA: Diagnosis not present

## 2021-02-01 DIAGNOSIS — Z79899 Other long term (current) drug therapy: Secondary | ICD-10-CM | POA: Diagnosis not present

## 2021-02-27 ENCOUNTER — Other Ambulatory Visit: Payer: Self-pay

## 2021-02-27 ENCOUNTER — Other Ambulatory Visit: Payer: Self-pay | Admitting: Physician Assistant

## 2021-02-27 ENCOUNTER — Ambulatory Visit
Admission: RE | Admit: 2021-02-27 | Discharge: 2021-02-27 | Disposition: A | Discharge status: Home / Self Care | Payer: Medicare HMO | Source: Ambulatory Visit | Attending: Physician Assistant | Admitting: Physician Assistant

## 2021-02-27 DIAGNOSIS — I82431 Acute embolism and thrombosis of right popliteal vein: Secondary | ICD-10-CM

## 2021-02-27 DIAGNOSIS — M79604 Pain in right leg: Secondary | ICD-10-CM | POA: Diagnosis not present

## 2021-02-27 DIAGNOSIS — S82001A Unspecified fracture of right patella, initial encounter for closed fracture: Secondary | ICD-10-CM | POA: Diagnosis not present

## 2021-02-27 DIAGNOSIS — R600 Localized edema: Secondary | ICD-10-CM | POA: Diagnosis not present

## 2021-06-01 DIAGNOSIS — Z87891 Personal history of nicotine dependence: Secondary | ICD-10-CM | POA: Diagnosis not present

## 2021-06-01 DIAGNOSIS — N529 Male erectile dysfunction, unspecified: Secondary | ICD-10-CM | POA: Diagnosis not present

## 2021-06-01 DIAGNOSIS — Z7982 Long term (current) use of aspirin: Secondary | ICD-10-CM | POA: Diagnosis not present

## 2021-06-01 DIAGNOSIS — G8929 Other chronic pain: Secondary | ICD-10-CM | POA: Diagnosis not present

## 2021-06-01 DIAGNOSIS — M79606 Pain in leg, unspecified: Secondary | ICD-10-CM | POA: Diagnosis not present

## 2021-06-01 DIAGNOSIS — R32 Unspecified urinary incontinence: Secondary | ICD-10-CM | POA: Diagnosis not present

## 2021-06-01 DIAGNOSIS — N400 Enlarged prostate without lower urinary tract symptoms: Secondary | ICD-10-CM | POA: Diagnosis not present

## 2021-06-01 DIAGNOSIS — E1165 Type 2 diabetes mellitus with hyperglycemia: Secondary | ICD-10-CM | POA: Diagnosis not present

## 2021-06-01 DIAGNOSIS — I2510 Atherosclerotic heart disease of native coronary artery without angina pectoris: Secondary | ICD-10-CM | POA: Diagnosis not present

## 2021-06-01 DIAGNOSIS — I10 Essential (primary) hypertension: Secondary | ICD-10-CM | POA: Diagnosis not present

## 2021-07-03 DIAGNOSIS — I350 Nonrheumatic aortic (valve) stenosis: Secondary | ICD-10-CM | POA: Diagnosis not present

## 2021-07-03 DIAGNOSIS — I10 Essential (primary) hypertension: Secondary | ICD-10-CM | POA: Diagnosis not present

## 2021-07-03 DIAGNOSIS — I25119 Atherosclerotic heart disease of native coronary artery with unspecified angina pectoris: Secondary | ICD-10-CM | POA: Diagnosis not present

## 2021-07-03 DIAGNOSIS — I6523 Occlusion and stenosis of bilateral carotid arteries: Secondary | ICD-10-CM | POA: Diagnosis not present

## 2021-07-03 DIAGNOSIS — I152 Hypertension secondary to endocrine disorders: Secondary | ICD-10-CM | POA: Diagnosis not present

## 2021-07-03 DIAGNOSIS — E1159 Type 2 diabetes mellitus with other circulatory complications: Secondary | ICD-10-CM | POA: Diagnosis not present

## 2021-07-03 DIAGNOSIS — R001 Bradycardia, unspecified: Secondary | ICD-10-CM | POA: Diagnosis not present

## 2021-07-23 DIAGNOSIS — I6523 Occlusion and stenosis of bilateral carotid arteries: Secondary | ICD-10-CM | POA: Diagnosis not present

## 2021-07-23 DIAGNOSIS — I499 Cardiac arrhythmia, unspecified: Secondary | ICD-10-CM | POA: Diagnosis not present

## 2021-07-23 DIAGNOSIS — I350 Nonrheumatic aortic (valve) stenosis: Secondary | ICD-10-CM | POA: Diagnosis not present

## 2021-07-23 DIAGNOSIS — E119 Type 2 diabetes mellitus without complications: Secondary | ICD-10-CM | POA: Diagnosis not present

## 2021-07-23 DIAGNOSIS — I10 Essential (primary) hypertension: Secondary | ICD-10-CM | POA: Diagnosis not present

## 2021-08-02 DIAGNOSIS — E1142 Type 2 diabetes mellitus with diabetic polyneuropathy: Secondary | ICD-10-CM | POA: Diagnosis not present

## 2021-08-02 DIAGNOSIS — E1169 Type 2 diabetes mellitus with other specified complication: Secondary | ICD-10-CM | POA: Diagnosis not present

## 2021-08-02 DIAGNOSIS — D61818 Other pancytopenia: Secondary | ICD-10-CM | POA: Diagnosis not present

## 2021-08-02 DIAGNOSIS — I6523 Occlusion and stenosis of bilateral carotid arteries: Secondary | ICD-10-CM | POA: Diagnosis not present

## 2021-08-02 DIAGNOSIS — I350 Nonrheumatic aortic (valve) stenosis: Secondary | ICD-10-CM | POA: Diagnosis not present

## 2021-08-02 DIAGNOSIS — I10 Essential (primary) hypertension: Secondary | ICD-10-CM | POA: Diagnosis not present

## 2021-08-02 DIAGNOSIS — E7800 Pure hypercholesterolemia, unspecified: Secondary | ICD-10-CM | POA: Diagnosis not present

## 2021-08-02 DIAGNOSIS — Z79899 Other long term (current) drug therapy: Secondary | ICD-10-CM | POA: Diagnosis not present

## 2021-08-02 DIAGNOSIS — I2510 Atherosclerotic heart disease of native coronary artery without angina pectoris: Secondary | ICD-10-CM | POA: Diagnosis not present

## 2021-08-06 DIAGNOSIS — E7800 Pure hypercholesterolemia, unspecified: Secondary | ICD-10-CM | POA: Diagnosis not present

## 2021-08-06 DIAGNOSIS — I350 Nonrheumatic aortic (valve) stenosis: Secondary | ICD-10-CM | POA: Diagnosis not present

## 2021-08-06 DIAGNOSIS — I10 Essential (primary) hypertension: Secondary | ICD-10-CM | POA: Diagnosis not present

## 2021-08-06 DIAGNOSIS — I25119 Atherosclerotic heart disease of native coronary artery with unspecified angina pectoris: Secondary | ICD-10-CM | POA: Diagnosis not present

## 2021-08-06 DIAGNOSIS — I441 Atrioventricular block, second degree: Secondary | ICD-10-CM | POA: Diagnosis not present

## 2021-08-06 DIAGNOSIS — R001 Bradycardia, unspecified: Secondary | ICD-10-CM | POA: Diagnosis not present

## 2021-08-06 DIAGNOSIS — I6523 Occlusion and stenosis of bilateral carotid arteries: Secondary | ICD-10-CM | POA: Diagnosis not present

## 2021-10-04 DIAGNOSIS — E119 Type 2 diabetes mellitus without complications: Secondary | ICD-10-CM | POA: Diagnosis not present

## 2021-10-04 DIAGNOSIS — H524 Presbyopia: Secondary | ICD-10-CM | POA: Diagnosis not present

## 2022-02-04 DIAGNOSIS — E1169 Type 2 diabetes mellitus with other specified complication: Secondary | ICD-10-CM | POA: Diagnosis not present

## 2022-02-04 DIAGNOSIS — Z1331 Encounter for screening for depression: Secondary | ICD-10-CM | POA: Diagnosis not present

## 2022-02-04 DIAGNOSIS — E1142 Type 2 diabetes mellitus with diabetic polyneuropathy: Secondary | ICD-10-CM | POA: Diagnosis not present

## 2022-02-04 DIAGNOSIS — I350 Nonrheumatic aortic (valve) stenosis: Secondary | ICD-10-CM | POA: Diagnosis not present

## 2022-02-04 DIAGNOSIS — Z0000 Encounter for general adult medical examination without abnormal findings: Secondary | ICD-10-CM | POA: Diagnosis not present

## 2022-02-04 DIAGNOSIS — I2510 Atherosclerotic heart disease of native coronary artery without angina pectoris: Secondary | ICD-10-CM | POA: Diagnosis not present

## 2022-02-04 DIAGNOSIS — E7800 Pure hypercholesterolemia, unspecified: Secondary | ICD-10-CM | POA: Diagnosis not present

## 2022-02-04 DIAGNOSIS — Z79899 Other long term (current) drug therapy: Secondary | ICD-10-CM | POA: Diagnosis not present

## 2022-02-04 DIAGNOSIS — Z23 Encounter for immunization: Secondary | ICD-10-CM | POA: Diagnosis not present

## 2022-02-04 DIAGNOSIS — I6523 Occlusion and stenosis of bilateral carotid arteries: Secondary | ICD-10-CM | POA: Diagnosis not present

## 2022-02-04 DIAGNOSIS — N401 Enlarged prostate with lower urinary tract symptoms: Secondary | ICD-10-CM | POA: Diagnosis not present

## 2022-02-12 DIAGNOSIS — I6523 Occlusion and stenosis of bilateral carotid arteries: Secondary | ICD-10-CM | POA: Diagnosis not present

## 2022-02-12 DIAGNOSIS — I10 Essential (primary) hypertension: Secondary | ICD-10-CM | POA: Diagnosis not present

## 2022-02-12 DIAGNOSIS — I350 Nonrheumatic aortic (valve) stenosis: Secondary | ICD-10-CM | POA: Diagnosis not present

## 2022-02-12 DIAGNOSIS — I25119 Atherosclerotic heart disease of native coronary artery with unspecified angina pectoris: Secondary | ICD-10-CM | POA: Diagnosis not present

## 2022-03-05 IMAGING — US US EXTREM LOW VENOUS*R*
1 series · 13 of 24 positions shown · non-contrast
Comparison: None.

CLINICAL DATA: Right lower extremity pain and edema.



[Series 1: us venous img lower bilat (dvt) · portal-venous · 13 of 37 slices shown]
[im 1/37]
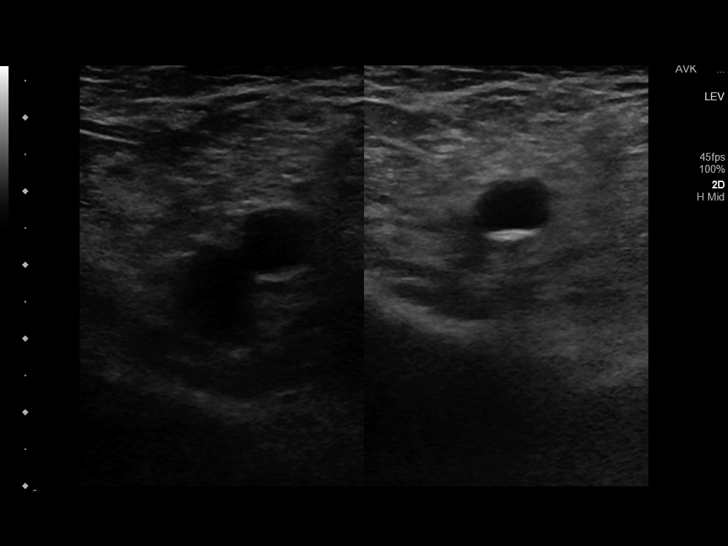
[im 4/37]
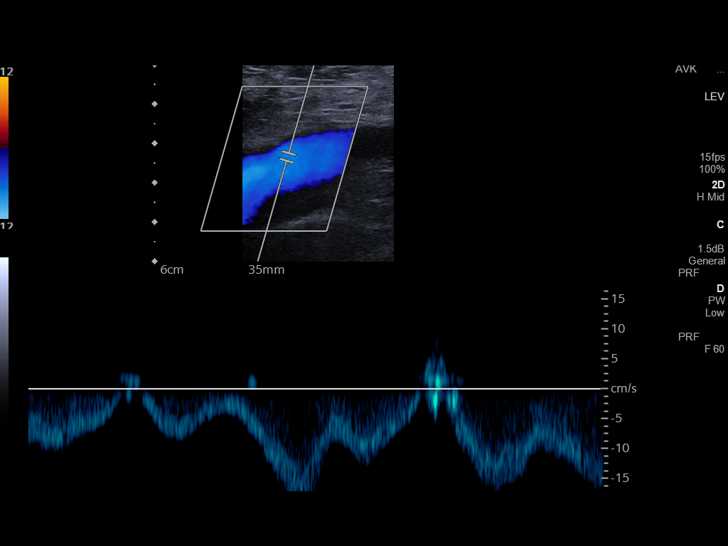
[im 7/37]
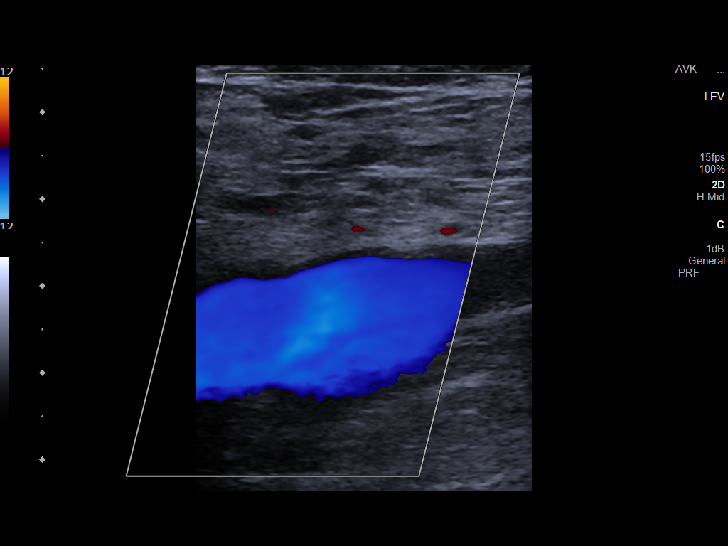
[im 10/37]
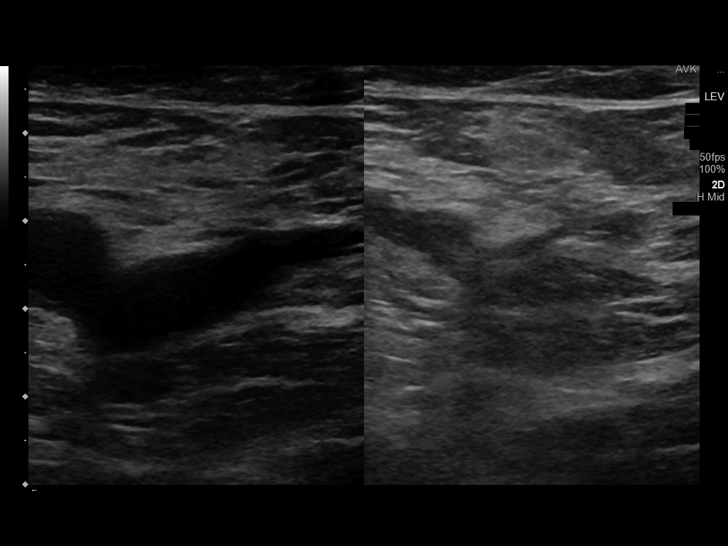
[im 13/37]
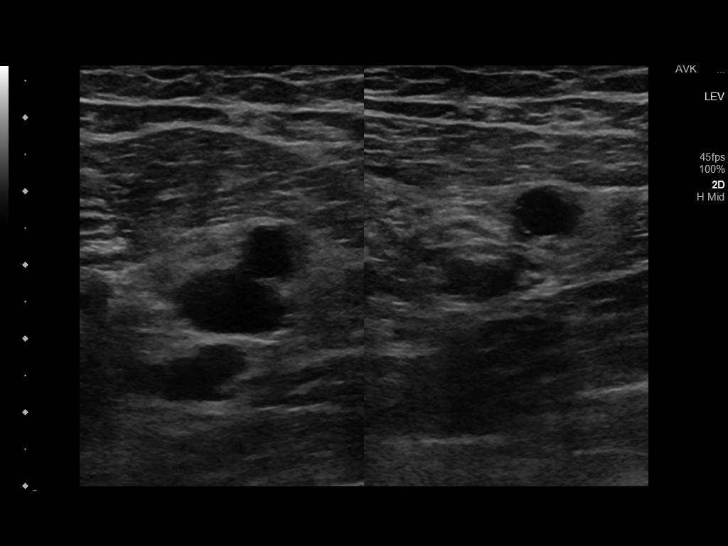
[im 16/37]
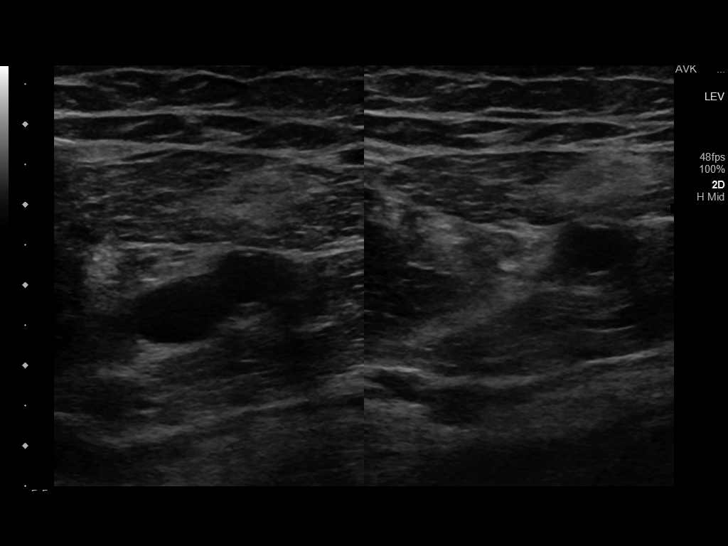
[im 19/37]
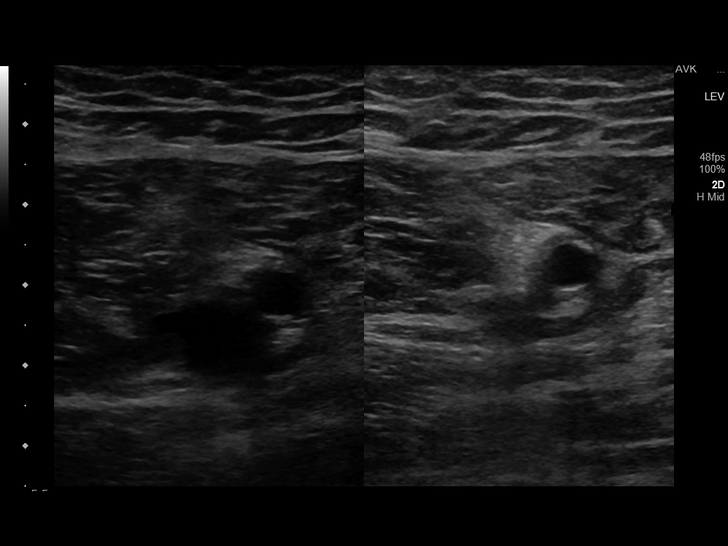
[im 21/37]
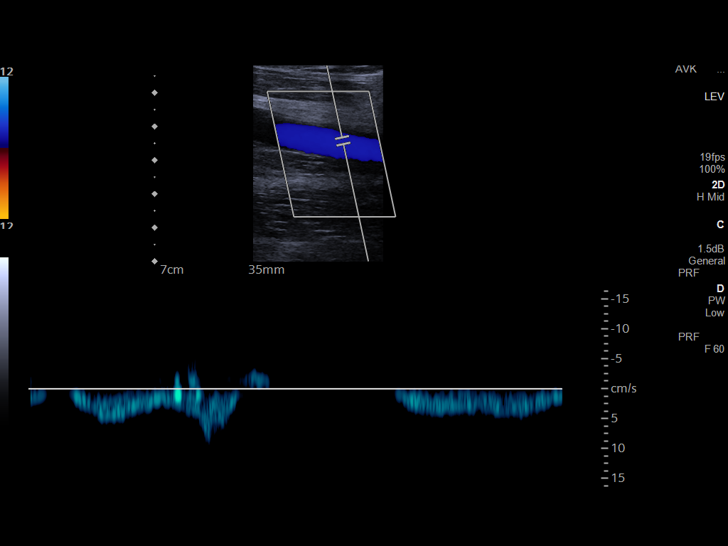
[im 24/37]
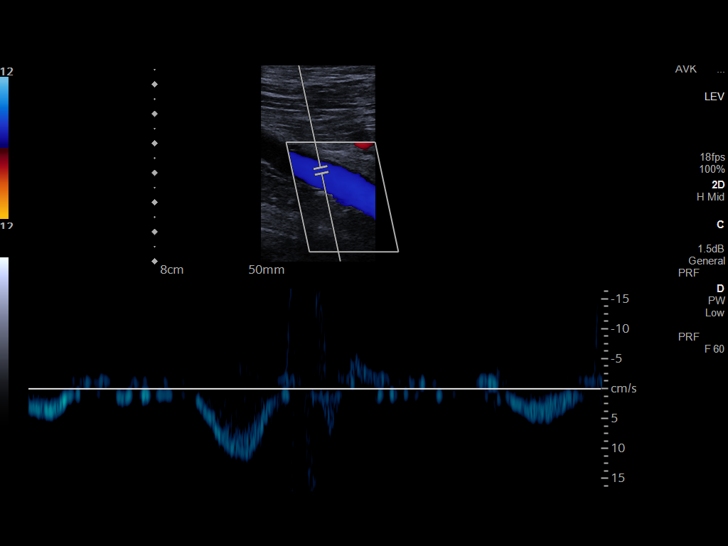
[im 27/37]
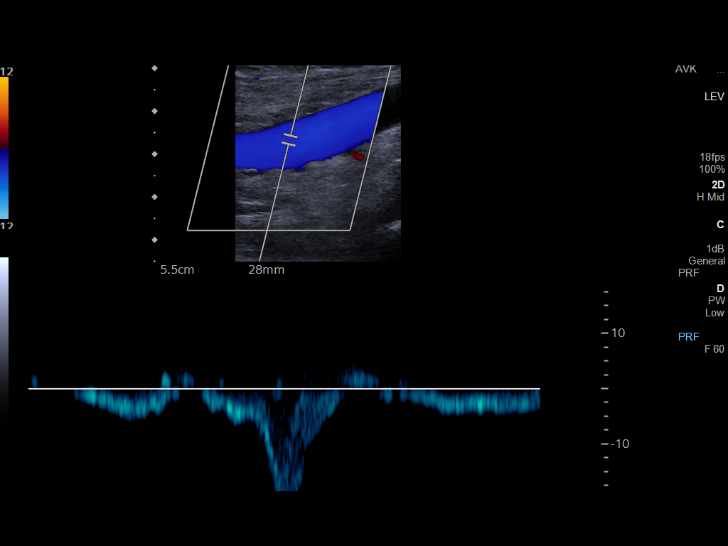
[im 30/37]
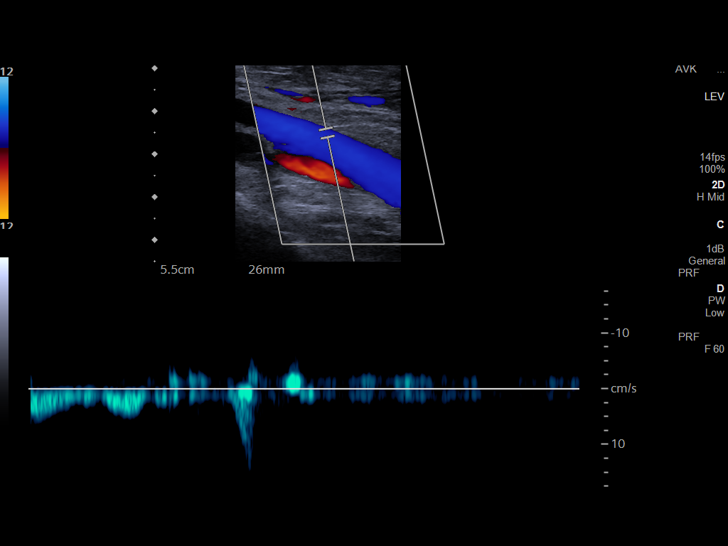
[im 33/37]
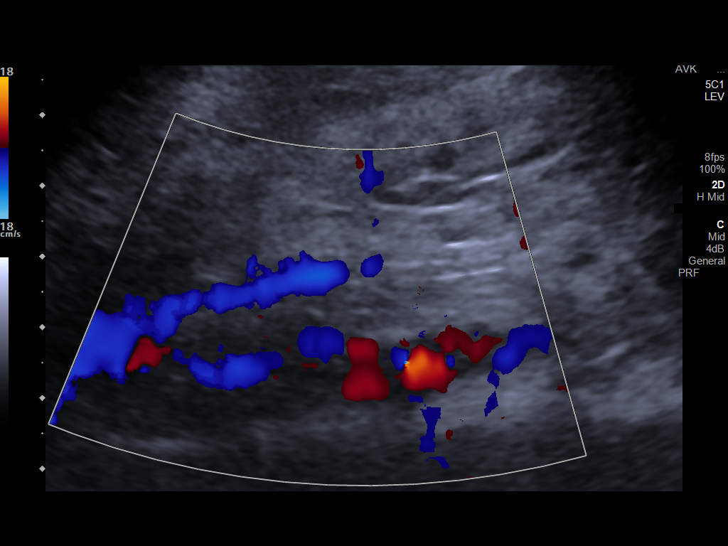
[im 37/37]
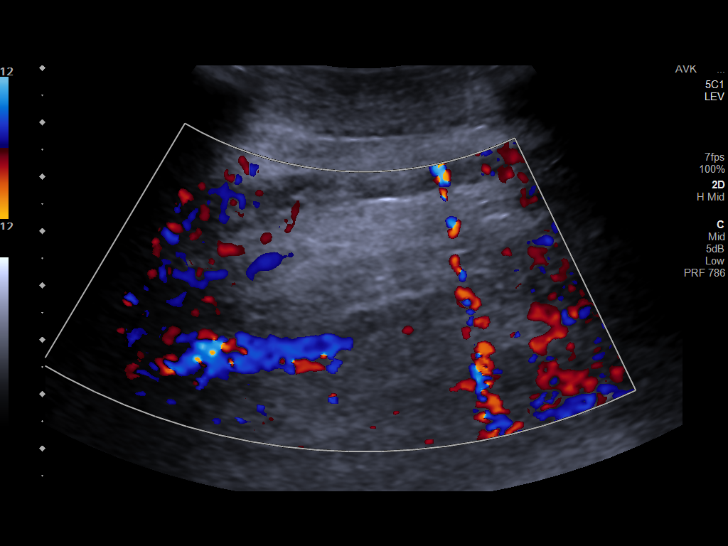

[13 of 24 positions shown; findings below may reference images not displayed]

FINDINGS: Contralateral Common Femoral Vein: Respiratory phasicity is normal
and symmetric with the symptomatic side. No evidence of thrombus.
Normal compressibility.

Common Femoral Vein: No evidence of thrombus. Normal
compressibility, respiratory phasicity and response to augmentation.

Saphenofemoral Junction: No evidence of thrombus. Normal
compressibility and flow on color Doppler imaging.

Profunda Femoral Vein: No evidence of thrombus. Normal
compressibility and flow on color Doppler imaging.

Femoral Vein: No evidence of thrombus. Normal compressibility,
respiratory phasicity and response to augmentation.

Popliteal Vein: No evidence of thrombus. Normal compressibility,
respiratory phasicity and response to augmentation.

Calf Veins: No evidence of thrombus. Normal compressibility and flow
on color Doppler imaging.

Superficial Great Saphenous Vein: No evidence of thrombus. Normal
compressibility.

Venous Reflux:  None.

Other Findings: No evidence of superficial thrombophlebitis or
abnormal fluid collection.
IMPRESSION: No evidence of right lower extremity deep venous thrombosis.

## 2022-08-07 DIAGNOSIS — Z79899 Other long term (current) drug therapy: Secondary | ICD-10-CM | POA: Diagnosis not present

## 2022-08-07 DIAGNOSIS — I2510 Atherosclerotic heart disease of native coronary artery without angina pectoris: Secondary | ICD-10-CM | POA: Diagnosis not present

## 2022-08-07 DIAGNOSIS — D61818 Other pancytopenia: Secondary | ICD-10-CM | POA: Diagnosis not present

## 2022-08-07 DIAGNOSIS — E7800 Pure hypercholesterolemia, unspecified: Secondary | ICD-10-CM | POA: Diagnosis not present

## 2022-08-07 DIAGNOSIS — E1159 Type 2 diabetes mellitus with other circulatory complications: Secondary | ICD-10-CM | POA: Diagnosis not present

## 2022-08-07 DIAGNOSIS — E1142 Type 2 diabetes mellitus with diabetic polyneuropathy: Secondary | ICD-10-CM | POA: Diagnosis not present

## 2022-08-07 DIAGNOSIS — I10 Essential (primary) hypertension: Secondary | ICD-10-CM | POA: Diagnosis not present

## 2022-08-07 DIAGNOSIS — I152 Hypertension secondary to endocrine disorders: Secondary | ICD-10-CM | POA: Diagnosis not present

## 2022-08-07 DIAGNOSIS — I6523 Occlusion and stenosis of bilateral carotid arteries: Secondary | ICD-10-CM | POA: Diagnosis not present

## 2022-08-12 DIAGNOSIS — E7800 Pure hypercholesterolemia, unspecified: Secondary | ICD-10-CM | POA: Diagnosis not present

## 2022-08-12 DIAGNOSIS — I6521 Occlusion and stenosis of right carotid artery: Secondary | ICD-10-CM | POA: Diagnosis not present

## 2022-08-12 DIAGNOSIS — I6523 Occlusion and stenosis of bilateral carotid arteries: Secondary | ICD-10-CM | POA: Diagnosis not present

## 2022-08-12 DIAGNOSIS — E1159 Type 2 diabetes mellitus with other circulatory complications: Secondary | ICD-10-CM | POA: Diagnosis not present

## 2022-08-12 DIAGNOSIS — I350 Nonrheumatic aortic (valve) stenosis: Secondary | ICD-10-CM | POA: Diagnosis not present

## 2022-08-12 DIAGNOSIS — I10 Essential (primary) hypertension: Secondary | ICD-10-CM | POA: Diagnosis not present

## 2022-08-12 DIAGNOSIS — R001 Bradycardia, unspecified: Secondary | ICD-10-CM | POA: Diagnosis not present

## 2022-08-12 DIAGNOSIS — I2089 Other forms of angina pectoris: Secondary | ICD-10-CM | POA: Diagnosis not present

## 2022-08-12 DIAGNOSIS — I441 Atrioventricular block, second degree: Secondary | ICD-10-CM | POA: Diagnosis not present

## 2022-10-13 DIAGNOSIS — H2513 Age-related nuclear cataract, bilateral: Secondary | ICD-10-CM | POA: Diagnosis not present

## 2022-10-29 DIAGNOSIS — Z01818 Encounter for other preprocedural examination: Secondary | ICD-10-CM | POA: Diagnosis not present

## 2022-10-29 DIAGNOSIS — I152 Hypertension secondary to endocrine disorders: Secondary | ICD-10-CM | POA: Diagnosis not present

## 2022-10-29 DIAGNOSIS — I25119 Atherosclerotic heart disease of native coronary artery with unspecified angina pectoris: Secondary | ICD-10-CM | POA: Diagnosis not present

## 2022-10-29 DIAGNOSIS — E1159 Type 2 diabetes mellitus with other circulatory complications: Secondary | ICD-10-CM | POA: Diagnosis not present

## 2022-10-29 DIAGNOSIS — I350 Nonrheumatic aortic (valve) stenosis: Secondary | ICD-10-CM | POA: Diagnosis not present

## 2022-10-29 DIAGNOSIS — R001 Bradycardia, unspecified: Secondary | ICD-10-CM | POA: Diagnosis not present

## 2022-10-29 DIAGNOSIS — I6523 Occlusion and stenosis of bilateral carotid arteries: Secondary | ICD-10-CM | POA: Diagnosis not present

## 2022-11-11 DIAGNOSIS — Z8679 Personal history of other diseases of the circulatory system: Secondary | ICD-10-CM | POA: Diagnosis not present

## 2022-11-11 DIAGNOSIS — E785 Hyperlipidemia, unspecified: Secondary | ICD-10-CM | POA: Diagnosis not present

## 2022-11-11 DIAGNOSIS — I6523 Occlusion and stenosis of bilateral carotid arteries: Secondary | ICD-10-CM | POA: Diagnosis not present

## 2022-11-11 DIAGNOSIS — E119 Type 2 diabetes mellitus without complications: Secondary | ICD-10-CM | POA: Diagnosis not present

## 2022-11-11 DIAGNOSIS — I350 Nonrheumatic aortic (valve) stenosis: Secondary | ICD-10-CM | POA: Diagnosis not present

## 2022-11-11 DIAGNOSIS — I10 Essential (primary) hypertension: Secondary | ICD-10-CM | POA: Diagnosis not present

## 2022-11-11 DIAGNOSIS — I25119 Atherosclerotic heart disease of native coronary artery with unspecified angina pectoris: Secondary | ICD-10-CM | POA: Diagnosis not present

## 2022-11-20 DIAGNOSIS — I25119 Atherosclerotic heart disease of native coronary artery with unspecified angina pectoris: Secondary | ICD-10-CM | POA: Diagnosis not present

## 2022-11-20 DIAGNOSIS — I350 Nonrheumatic aortic (valve) stenosis: Secondary | ICD-10-CM | POA: Diagnosis not present

## 2022-11-25 DIAGNOSIS — R001 Bradycardia, unspecified: Secondary | ICD-10-CM | POA: Diagnosis not present

## 2022-12-02 ENCOUNTER — Other Ambulatory Visit: Payer: Self-pay | Admitting: Internal Medicine

## 2022-12-02 DIAGNOSIS — I441 Atrioventricular block, second degree: Secondary | ICD-10-CM

## 2022-12-02 DIAGNOSIS — I350 Nonrheumatic aortic (valve) stenosis: Secondary | ICD-10-CM | POA: Diagnosis not present

## 2022-12-02 DIAGNOSIS — I6523 Occlusion and stenosis of bilateral carotid arteries: Secondary | ICD-10-CM

## 2022-12-02 DIAGNOSIS — I10 Essential (primary) hypertension: Secondary | ICD-10-CM

## 2022-12-02 DIAGNOSIS — I25119 Atherosclerotic heart disease of native coronary artery with unspecified angina pectoris: Secondary | ICD-10-CM | POA: Diagnosis not present

## 2022-12-03 ENCOUNTER — Ambulatory Visit
Admission: RE | Admit: 2022-12-03 | Discharge: 2022-12-03 | Disposition: A | Discharge status: Home / Self Care | Payer: Medicare HMO | Source: Ambulatory Visit | Attending: Internal Medicine | Admitting: Internal Medicine

## 2022-12-03 DIAGNOSIS — I350 Nonrheumatic aortic (valve) stenosis: Secondary | ICD-10-CM | POA: Diagnosis not present

## 2022-12-03 DIAGNOSIS — I6523 Occlusion and stenosis of bilateral carotid arteries: Secondary | ICD-10-CM | POA: Diagnosis not present

## 2022-12-03 DIAGNOSIS — I700 Atherosclerosis of aorta: Secondary | ICD-10-CM | POA: Diagnosis not present

## 2022-12-03 DIAGNOSIS — I25119 Atherosclerotic heart disease of native coronary artery with unspecified angina pectoris: Secondary | ICD-10-CM | POA: Diagnosis not present

## 2022-12-03 DIAGNOSIS — I10 Essential (primary) hypertension: Secondary | ICD-10-CM | POA: Diagnosis not present

## 2022-12-03 DIAGNOSIS — I441 Atrioventricular block, second degree: Secondary | ICD-10-CM | POA: Insufficient documentation

## 2022-12-03 LAB — POCT I-STAT CREATININE: Creatinine, Ser: 1.1 mg/dL (ref 0.61–1.24)

## 2022-12-03 MED ORDER — IOHEXOL 350 MG/ML SOLN
100.0000 mL | Freq: Once | INTRAVENOUS | Status: AC | PRN
  Administered 2022-12-03: 75 mL via INTRAVENOUS

## 2023-01-01 DIAGNOSIS — H2511 Age-related nuclear cataract, right eye: Secondary | ICD-10-CM | POA: Diagnosis not present

## 2023-01-01 DIAGNOSIS — Z87891 Personal history of nicotine dependence: Secondary | ICD-10-CM | POA: Diagnosis not present

## 2023-01-01 DIAGNOSIS — I10 Essential (primary) hypertension: Secondary | ICD-10-CM | POA: Diagnosis not present

## 2023-01-01 DIAGNOSIS — I441 Atrioventricular block, second degree: Secondary | ICD-10-CM | POA: Diagnosis not present

## 2023-01-01 DIAGNOSIS — I2510 Atherosclerotic heart disease of native coronary artery without angina pectoris: Secondary | ICD-10-CM | POA: Diagnosis not present

## 2023-01-01 DIAGNOSIS — E1136 Type 2 diabetes mellitus with diabetic cataract: Secondary | ICD-10-CM | POA: Diagnosis not present

## 2023-01-01 DIAGNOSIS — E1142 Type 2 diabetes mellitus with diabetic polyneuropathy: Secondary | ICD-10-CM | POA: Diagnosis not present

## 2023-01-01 DIAGNOSIS — E119 Type 2 diabetes mellitus without complications: Secondary | ICD-10-CM | POA: Diagnosis not present

## 2023-01-01 DIAGNOSIS — Z888 Allergy status to other drugs, medicaments and biological substances status: Secondary | ICD-10-CM | POA: Diagnosis not present

## 2023-02-09 DIAGNOSIS — H35721 Serous detachment of retinal pigment epithelium, right eye: Secondary | ICD-10-CM | POA: Diagnosis not present

## 2023-02-13 DIAGNOSIS — I2510 Atherosclerotic heart disease of native coronary artery without angina pectoris: Secondary | ICD-10-CM | POA: Diagnosis not present

## 2023-02-13 DIAGNOSIS — I350 Nonrheumatic aortic (valve) stenosis: Secondary | ICD-10-CM | POA: Diagnosis not present

## 2023-02-13 DIAGNOSIS — Z23 Encounter for immunization: Secondary | ICD-10-CM | POA: Diagnosis not present

## 2023-02-13 DIAGNOSIS — I6523 Occlusion and stenosis of bilateral carotid arteries: Secondary | ICD-10-CM | POA: Diagnosis not present

## 2023-02-13 DIAGNOSIS — Z0000 Encounter for general adult medical examination without abnormal findings: Secondary | ICD-10-CM | POA: Diagnosis not present

## 2023-02-13 DIAGNOSIS — E1159 Type 2 diabetes mellitus with other circulatory complications: Secondary | ICD-10-CM | POA: Diagnosis not present

## 2023-02-13 DIAGNOSIS — N401 Enlarged prostate with lower urinary tract symptoms: Secondary | ICD-10-CM | POA: Diagnosis not present

## 2023-02-13 DIAGNOSIS — Z79899 Other long term (current) drug therapy: Secondary | ICD-10-CM | POA: Diagnosis not present

## 2023-02-13 DIAGNOSIS — E7800 Pure hypercholesterolemia, unspecified: Secondary | ICD-10-CM | POA: Diagnosis not present

## 2023-02-13 DIAGNOSIS — Z1331 Encounter for screening for depression: Secondary | ICD-10-CM | POA: Diagnosis not present

## 2023-02-13 DIAGNOSIS — E1142 Type 2 diabetes mellitus with diabetic polyneuropathy: Secondary | ICD-10-CM | POA: Diagnosis not present

## 2023-03-16 DIAGNOSIS — H35351 Cystoid macular degeneration, right eye: Secondary | ICD-10-CM | POA: Diagnosis not present

## 2023-04-24 DIAGNOSIS — Z9889 Other specified postprocedural states: Secondary | ICD-10-CM | POA: Diagnosis not present

## 2023-04-24 DIAGNOSIS — Z9841 Cataract extraction status, right eye: Secondary | ICD-10-CM | POA: Diagnosis not present

## 2023-04-24 DIAGNOSIS — H2513 Age-related nuclear cataract, bilateral: Secondary | ICD-10-CM | POA: Diagnosis not present

## 2023-04-24 DIAGNOSIS — H524 Presbyopia: Secondary | ICD-10-CM | POA: Diagnosis not present

## 2023-04-24 DIAGNOSIS — H538 Other visual disturbances: Secondary | ICD-10-CM | POA: Diagnosis not present

## 2023-06-08 DIAGNOSIS — I6523 Occlusion and stenosis of bilateral carotid arteries: Secondary | ICD-10-CM | POA: Diagnosis not present

## 2023-06-08 DIAGNOSIS — I25119 Atherosclerotic heart disease of native coronary artery with unspecified angina pectoris: Secondary | ICD-10-CM | POA: Diagnosis not present

## 2023-06-08 DIAGNOSIS — I350 Nonrheumatic aortic (valve) stenosis: Secondary | ICD-10-CM | POA: Diagnosis not present

## 2023-06-08 DIAGNOSIS — I10 Essential (primary) hypertension: Secondary | ICD-10-CM | POA: Diagnosis not present

## 2023-06-08 DIAGNOSIS — I441 Atrioventricular block, second degree: Secondary | ICD-10-CM | POA: Diagnosis not present

## 2023-07-14 ENCOUNTER — Ambulatory Visit: Admission: EM | Admit: 2023-07-14 | Discharge: 2023-07-14

## 2023-07-14 ENCOUNTER — Emergency Department

## 2023-07-14 ENCOUNTER — Emergency Department: Admission: EM | Admit: 2023-07-14 | Discharge: 2023-07-14 | Disposition: A | Discharge status: Home / Self Care

## 2023-07-14 DIAGNOSIS — I10 Essential (primary) hypertension: Secondary | ICD-10-CM | POA: Diagnosis not present

## 2023-07-14 DIAGNOSIS — Y92812 Truck as the place of occurrence of the external cause: Secondary | ICD-10-CM | POA: Insufficient documentation

## 2023-07-14 DIAGNOSIS — S61511A Laceration without foreign body of right wrist, initial encounter: Secondary | ICD-10-CM

## 2023-07-14 DIAGNOSIS — W268XXA Contact with other sharp object(s), not elsewhere classified, initial encounter: Secondary | ICD-10-CM | POA: Diagnosis not present

## 2023-07-14 DIAGNOSIS — I2510 Atherosclerotic heart disease of native coronary artery without angina pectoris: Secondary | ICD-10-CM | POA: Insufficient documentation

## 2023-07-14 DIAGNOSIS — Y9389 Activity, other specified: Secondary | ICD-10-CM | POA: Diagnosis not present

## 2023-07-14 DIAGNOSIS — E119 Type 2 diabetes mellitus without complications: Secondary | ICD-10-CM | POA: Insufficient documentation

## 2023-07-14 DIAGNOSIS — M1811 Unilateral primary osteoarthritis of first carpometacarpal joint, right hand: Secondary | ICD-10-CM | POA: Diagnosis not present

## 2023-07-14 DIAGNOSIS — S6991XA Unspecified injury of right wrist, hand and finger(s), initial encounter: Secondary | ICD-10-CM | POA: Diagnosis present

## 2023-07-14 MED ORDER — BACITRACIN ZINC 500 UNIT/GM EX OINT
TOPICAL_OINTMENT | Freq: Once | CUTANEOUS | Status: AC
  Administered 2023-07-14: 1 via TOPICAL
  Filled 2023-07-14: qty 0.9

## 2023-07-14 MED ORDER — LIDOCAINE-EPINEPHRINE (PF) 2 %-1:200000 IJ SOLN
20.0000 mL | Freq: Once | INTRAMUSCULAR | Status: AC
  Administered 2023-07-14: 20 mL
  Filled 2023-07-14: qty 20

## 2023-07-14 NOTE — ED Notes (Signed)
 Torniquet placed at this time on RUE

## 2023-07-14 NOTE — ED Triage Notes (Addendum)
 Pt to ED via POV from UC. Pt was working on a truck and cut his right wrist on something from the truck. Pt sent from [facility] due to concern for arterial bleed. Pt ambulatory to triage with laceration wrapped but bleeding excessively from wrist. Pressure applied by this RN. Pt taken to room 17. Tourniquet applied by 2nd RN. Pt A&Ox4. Pt denies dizziness. Pt denies blood thinners. IV access obtained

## 2023-07-14 NOTE — Discharge Instructions (Signed)
 I am concerned that you have a deeper injury to your wrist then we can manage here at urgent care.  I am also concerned that she may have caught part of your ulnar artery.  Please go to the emergency department at [facility] for further evaluation.

## 2023-07-14 NOTE — ED Notes (Signed)
 MD @bedside , irrigating wound and suturing

## 2023-07-14 NOTE — ED Provider Notes (Signed)
 [facility] Provider Note    Event Date/Time   First MD Initiated Contact with Patient 07/14/23 1307     (approximate)   History   Laceration   HPI  Eduardo Ashley is a 89 y.o. male PMH CAD, aortic valve stenosis, diabetes, hypertension presents for evaluation of right wrist laceration.  Originally seen in urgent care, transferred to ED bed given concern for possible arterial bleed. -Patient was standing on a truck bed, fell over hitting his right wrist (he thinks possibly had a screwdriver).  No head strike, ambulatory after event.  No complaints other than right wrist bleeding.  Not on thinners.     Physical Exam   Triage Vital Signs: ED Triage Vitals  Encounter Vitals Group     BP 07/14/23 1313 (!) 187/72     Systolic BP Percentile --      Diastolic BP Percentile --      Pulse Rate 07/14/23 1312 80     Resp 07/14/23 1312 20     Temp 07/14/23 1312 98 F (36.7 C)     Temp Source 07/14/23 1312 Oral     SpO2 07/14/23 1312 99 %     Weight --      Height --      Head Circumference --      Peak Flow --      Pain Score 07/14/23 1540 0     Pain Loc --      Pain Education --      Exclude from Growth Chart --     Most recent vital signs: Vitals:   07/14/23 1313 07/14/23 1500  BP: (!) 187/72 (!) 164/64  Pulse:  (!) 41  Resp:  18  Temp:    SpO2:  97%     General: Awake, no distress.  CV:  Good peripheral perfusion.  Resp:  Normal effort.  Abd:  No distention.  RUE:   1.5 cm avulsion serration to ventral right wrist.  Some oozing of blood though no pulsatile bleeding on my initial eval.  Reevaluation shortly thereafter, wound irrigated and explored through full range of motion in bloodless field, no tendon exposed, no ongoing bleeding, no foreign bodies nor visualized bone.  Radian/median/ulnar motor intact.  RP 2+.  Good distal perfusion in all digits.  Full range of motion at the DIP and PIP of all digits.  Able to make fist without  difficulty.   ED Results / Procedures / Treatments   Labs (all labs ordered are listed, but only abnormal results are displayed) Labs Reviewed - No data to display   EKG N/a   RADIOLOGY X-rays of right wrist interpreted by me with no fracture or foreign bodies appreciated.  Radiology report reviewed.  PROCEDURES:  Critical Care performed: No  .Laceration Repair  Date/Time: 07/14/2023 6:30 PM  Performed by: Marinell Blight, MD Authorized by: Marinell Blight, MD   Consent:    Consent obtained:  Verbal   Consent given by:  Patient   Risks, benefits, and alternatives were discussed: yes     Risks discussed:  Infection, pain, retained foreign body, tendon damage, poor wound healing, vascular damage, nerve damage, need for additional repair and poor cosmetic result   Alternatives discussed:  No treatment and delayed treatment Universal protocol:    Procedure explained and questions answered to patient or proxy's satisfaction: yes     Relevant documents present and verified: yes     Imaging studies available: yes  Patient identity confirmed:  Verbally with patient and arm band Anesthesia:    Anesthesia method:  None Laceration details:    Location: wrist.   Length (cm):  1.5 Pre-procedure details:    Preparation:  Patient was prepped and draped in usual sterile fashion and imaging obtained to evaluate for foreign bodies Exploration:    Limited defect created (wound extended): no     Hemostasis achieved with:  Direct pressure   Imaging obtained: x-ray     Imaging outcome: foreign body not noted     Wound exploration: wound explored through full range of motion and entire depth of wound visualized     Wound extent: no foreign body, no signs of injury, no nerve damage, no tendon damage, no underlying fracture and no vascular damage     Contaminated: no   Treatment:    Area cleansed with:  Saline   Amount of cleaning:  Extensive   Irrigation solution:  Sterile saline    Irrigation volume:  250cc   Irrigation method:  Pressure wash   Visualized foreign bodies/material removed: no     Debridement:  None   Undermining:  None   Scar revision: no   Skin repair:    Repair method:  Sutures   Suture size:  5-0   Suture material:  Nylon   Suture technique:  Simple interrupted   Number of sutures:  2 Approximation:    Approximation:  Loose Repair type:    Repair type:  Simple Post-procedure details:    Dressing:  Antibiotic ointment and non-adherent dressing   Procedure completion:  Tolerated well, no immediate complications    MEDICATIONS ORDERED IN ED: Medications  lidocaine-EPINEPHrine (XYLOCAINE W/EPI) 2 %-1:200000 (PF) injection 20 mL (20 mLs Other Given by Other 07/14/23 1317)  bacitracin ointment (1 Application Topical Given 07/14/23 1433)     IMPRESSION / MDM / ASSESSMENT AND PLAN / ED COURSE  I reviewed the triage vital signs and the nursing notes.                              Differential diagnosis includes, but is not limited to, possible prior arterial bleeding that seems to have resolved, consider venous bleeding.  Consider possibility of retained foreign body or underlying fracture.  No obvious evidence of neurovascular damage on my eval however.  Will get screening x-ray and plan for avulsion flap closure, no deeper structure repair indicated on my initial eval.  DTaP up-to-date per chart review.  Patient's presentation is most consistent with acute complicated illness / injury requiring diagnostic workup. ED course below.  X-ray unremarkable on my read.  Wound remained without any further bleeding, wound flap closed with  two 5-0 nonabsorbable sutures, plan for removal in 7-10 days.  No dense of underlying tendinous or neurovascular damage at this time.   Clinical Course as of 07/14/23 1833  Tue Jul 14, 2023  1355 Right wrist reviewed, no obvious fracture on my read.  No foreign body.  Laceration/avulsion flap closed with two 5-0  nonabsorbable sutures. [MM]  1403 Last tdap 2021, no indication for rpt at this time [MM]    Clinical Course User Index [MM] Marinell Blight, MD     FINAL CLINICAL IMPRESSION(S) / ED DIAGNOSES   Final diagnoses:  Laceration of right wrist, initial encounter     Rx / DC Orders   ED Discharge Orders     None  Note:  This document was prepared using Dragon voice recognition software and may include unintentional dictation errors.   Marinell Blight, MD 07/14/23 [id number]

## 2023-07-14 NOTE — ED Notes (Signed)
 Patient is being discharged from the Urgent Care and sent to the Emergency Department via persona vehicle . Per Berneta Sages NP, patient is in need of higher level of care due to laceration to wrist. Patient is aware and verbalizes understanding of plan of care. There were no vitals filed for this visit.

## 2023-07-14 NOTE — ED Notes (Signed)
 Pts wound dressed at this time, Pt educated on care at home for sutures

## 2023-07-14 NOTE — ED Provider Notes (Signed)
 [facility]    CSN: [id number] Arrival date & time: 07/14/23  1220      History   Chief Complaint No chief complaint on file.   HPI Eduardo Ashley is a 89 y.o. male.   HPI  89 year old male brought straight back to room 9 for evaluation of a right wrist laceration.  Past Medical History:  Diagnosis Date   Aortic valve stenosis    Carotid artery stenosis    Coronary artery disease    Diabetes mellitus without complication (HCC)    GERD (gastroesophageal reflux disease)    Hypertension    Neuropathy    feet   Wears hearing aid in both ears     Patient Active Problem List   Diagnosis Date Noted   Angina decubitus (HCC) 06/22/2017    Past Surgical History:  Procedure Laterality Date   LEFT HEART CATH AND CORONARY ANGIOGRAPHY Left 06/25/2017   Procedure: LEFT HEART CATH AND CORONARY ANGIOGRAPHY;  Surgeon: Lamar Blinks, MD;  Location: [facility] INVASIVE CV LAB;  Service: Cardiovascular;  Laterality: Left;       Home Medications    Prior to Admission medications   Medication Sig Start Date End Date Taking? Authorizing Provider  acetaminophen (TYLENOL) 500 MG tablet Take 1,000 mg by mouth every 6 (six) hours as needed for moderate pain or headache.    [provider]  Ascorbic Acid (VITAMIN C PO) Take 1 tablet by mouth daily.    [provider]  aspirin EC 81 MG tablet Take 325 mg by mouth daily.     [provider]  Cholecalciferol (VITAMIN D3 PO) Take 1 capsule by mouth daily.    [provider]  Cyanocobalamin (VITAMIN B-12 PO) Take 1 tablet by mouth 2 (two) times daily.    [provider]  finasteride (PROSCAR) 5 MG tablet Take 5 mg by mouth daily.    [provider]  gabapentin (NEURONTIN) 600 MG tablet Take 600-1,200 mg by mouth See admin instructions. Take 600 mg by mouth in the morning and take 1200 mg by mouth at bedtime    [provider]  glimepiride (AMARYL) 2 MG tablet Take 2 mg  by mouth 2 (two) times daily.    [provider]  irbesartan (AVAPRO) 300 MG tablet Take 75 mg by mouth daily.     [provider]  lansoprazole (PREVACID) 15 MG capsule Take 15 mg by mouth daily.    [provider]  magnesium oxide (MAG-OX) 400 MG tablet Take 400 mg by mouth daily.    [provider]  metFORMIN (GLUCOPHAGE) 1000 MG tablet Take 1,000 mg by mouth 2 (two) times daily with a meal.    [provider]  metoprolol succinate (TOPROL-XL) 25 MG 24 hr tablet Take 25 mg by mouth daily.    [provider]  pravastatin (PRAVACHOL) 40 MG tablet Take 40 mg by mouth daily.    [provider]  sitaGLIPtin (JANUVIA) 100 MG tablet Take 100 mg by mouth daily.    [provider]  tamsulosin (FLOMAX) 0.4 MG CAPS capsule Take 0.4 mg by mouth daily.    [provider]    Family History No family history on file.  Social History Social History   Tobacco Use   Smoking status: Former    Current packs/day: 0.00    Types: Cigarettes    Quit date: 1969    Years since quitting: 56.2   Smokeless tobacco: Never  Vaping  Use   Vaping status: Never Used  Substance Use Topics   Alcohol use: No     Allergies   Hydrochlorothiazide, Lisinopril, and Lovastatin   Review of Systems Review of Systems   Physical Exam Triage Vital Signs ED Triage Vitals  Encounter Vitals Group     BP      Systolic BP Percentile      Diastolic BP Percentile      Pulse      Resp      Temp      Temp src      SpO2      Weight      Height      Head Circumference      Peak Flow      Pain Score      Pain Loc      Pain Education      Exclude from Growth Chart    No data found.  Updated Vital Signs There were no vitals taken for this visit.  Visual Acuity Right Eye Distance:   Left Eye Distance:   Bilateral Distance:    Right Eye Near:   Left Eye Near:    Bilateral Near:     Physical Exam   UC Treatments / Results   Labs (all labs ordered are listed, but only abnormal results are displayed) Labs Reviewed - No data to display  EKG   Radiology No results found.  Procedures Procedures (including critical care time)  Medications Ordered in UC Medications - No data to display  Initial Impression / Assessment and Plan / UC Course  I have reviewed the triage vital signs and the nursing notes.  Pertinent labs & imaging results that were available during my care of the patient were reviewed by me and considered in my medical decision making (see chart for details).   I evaluated the patient in room 9 as he was brought back directly from check-in due to a right wrist laceration.  Patient had a cloth wrapped around his wrist that was saturated in blood.  He denies any numbness or tingling in his fingers but states that when he moves his fingers he feels pain in his wrist.  The dressing was removed to reveal a chevron shaped laceration on the ulnar of the volar aspect of the right wrist.  There is steady bleeding with slight pulsation coming from the wound.  No active spurting.  Capillary refills less than 3 seconds.  Patient has full sensation in his fingers as well as full range of motion of his fingers.  The patient reports that he was working on a truck and he is unsure of what he cut his wrist on.  Due to the fact that he has got pain with movement and the pulsatile nature of the bleeding I am concerned for deeper structure injury and possible arterial injury.  Bleeding is controlled with direct pressure.  A pressure bandage was applied and I advised the patient that he should be evaluated in the emergency department.  He is elected to go to [facility] via POV.  Patient left ambulatory and in a stable condition.   Final Clinical Impressions(s) / UC Diagnoses   Final diagnoses:  Laceration of right wrist with complication, initial encounter     Discharge Instructions      I am concerned that you have a  deeper injury to your wrist then we can manage here at urgent care.  I am also concerned that she may  have caught part of your ulnar artery.  Please go to the emergency department at [facility] for further evaluation.     ED Prescriptions   None    PDMP not reviewed this encounter.   Becky Augusta, NP 07/14/23 1257

## 2023-07-14 NOTE — Discharge Instructions (Addendum)
 Evaluation in the emergency department was overall reassuring.  Bleeding of your wound stopped with pressure, and 2 sutures were placed and a skin flap over this--DC to be removed in 7-10 days by your primary provider.  X-ray showed no obvious fractures or foreign bodies on my interpretation.  Return to the emergency department with any new or worsening symptoms.

## 2023-07-14 NOTE — ED Notes (Signed)
 Dressing was unwrapped, wound is still bleeding, MD assessed and redressed

## 2023-07-21 DIAGNOSIS — Z4802 Encounter for removal of sutures: Secondary | ICD-10-CM | POA: Diagnosis not present

## 2023-08-18 DIAGNOSIS — E785 Hyperlipidemia, unspecified: Secondary | ICD-10-CM | POA: Diagnosis not present

## 2023-08-18 DIAGNOSIS — I350 Nonrheumatic aortic (valve) stenosis: Secondary | ICD-10-CM | POA: Diagnosis not present

## 2023-08-18 DIAGNOSIS — E7800 Pure hypercholesterolemia, unspecified: Secondary | ICD-10-CM | POA: Diagnosis not present

## 2023-08-18 DIAGNOSIS — I6523 Occlusion and stenosis of bilateral carotid arteries: Secondary | ICD-10-CM | POA: Diagnosis not present

## 2023-08-18 DIAGNOSIS — E1159 Type 2 diabetes mellitus with other circulatory complications: Secondary | ICD-10-CM | POA: Diagnosis not present

## 2023-08-18 DIAGNOSIS — E1140 Type 2 diabetes mellitus with diabetic neuropathy, unspecified: Secondary | ICD-10-CM | POA: Diagnosis not present

## 2023-08-18 DIAGNOSIS — I152 Hypertension secondary to endocrine disorders: Secondary | ICD-10-CM | POA: Diagnosis not present

## 2023-08-18 DIAGNOSIS — D61818 Other pancytopenia: Secondary | ICD-10-CM | POA: Diagnosis not present

## 2023-08-18 DIAGNOSIS — I10 Essential (primary) hypertension: Secondary | ICD-10-CM | POA: Diagnosis not present

## 2023-08-18 DIAGNOSIS — E1142 Type 2 diabetes mellitus with diabetic polyneuropathy: Secondary | ICD-10-CM | POA: Diagnosis not present

## 2023-08-18 DIAGNOSIS — Z87891 Personal history of nicotine dependence: Secondary | ICD-10-CM | POA: Diagnosis not present

## 2023-08-18 DIAGNOSIS — I2510 Atherosclerotic heart disease of native coronary artery without angina pectoris: Secondary | ICD-10-CM | POA: Diagnosis not present

## 2023-08-18 DIAGNOSIS — Z79899 Other long term (current) drug therapy: Secondary | ICD-10-CM | POA: Diagnosis not present

## 2023-08-18 DIAGNOSIS — N400 Enlarged prostate without lower urinary tract symptoms: Secondary | ICD-10-CM | POA: Diagnosis not present

## 2023-08-21 DIAGNOSIS — I350 Nonrheumatic aortic (valve) stenosis: Secondary | ICD-10-CM | POA: Diagnosis not present

## 2023-08-21 DIAGNOSIS — I25119 Atherosclerotic heart disease of native coronary artery with unspecified angina pectoris: Secondary | ICD-10-CM | POA: Diagnosis not present

## 2023-08-21 DIAGNOSIS — I441 Atrioventricular block, second degree: Secondary | ICD-10-CM | POA: Diagnosis not present

## 2023-08-21 DIAGNOSIS — E7800 Pure hypercholesterolemia, unspecified: Secondary | ICD-10-CM | POA: Diagnosis not present

## 2023-08-21 DIAGNOSIS — I10 Essential (primary) hypertension: Secondary | ICD-10-CM | POA: Diagnosis not present

## 2023-09-10 DIAGNOSIS — I350 Nonrheumatic aortic (valve) stenosis: Secondary | ICD-10-CM | POA: Diagnosis not present

## 2023-09-21 DIAGNOSIS — I350 Nonrheumatic aortic (valve) stenosis: Secondary | ICD-10-CM | POA: Diagnosis not present

## 2023-09-21 DIAGNOSIS — I10 Essential (primary) hypertension: Secondary | ICD-10-CM | POA: Diagnosis not present

## 2023-09-21 DIAGNOSIS — E7800 Pure hypercholesterolemia, unspecified: Secondary | ICD-10-CM | POA: Diagnosis not present

## 2023-09-21 DIAGNOSIS — I441 Atrioventricular block, second degree: Secondary | ICD-10-CM | POA: Diagnosis not present

## 2023-09-21 DIAGNOSIS — I25119 Atherosclerotic heart disease of native coronary artery with unspecified angina pectoris: Secondary | ICD-10-CM | POA: Diagnosis not present

## 2023-10-28 DIAGNOSIS — I25119 Atherosclerotic heart disease of native coronary artery with unspecified angina pectoris: Secondary | ICD-10-CM | POA: Diagnosis not present

## 2023-10-28 DIAGNOSIS — E1142 Type 2 diabetes mellitus with diabetic polyneuropathy: Secondary | ICD-10-CM | POA: Diagnosis not present

## 2023-10-28 DIAGNOSIS — I152 Hypertension secondary to endocrine disorders: Secondary | ICD-10-CM | POA: Diagnosis not present

## 2023-10-28 DIAGNOSIS — E1159 Type 2 diabetes mellitus with other circulatory complications: Secondary | ICD-10-CM | POA: Diagnosis not present

## 2023-10-28 DIAGNOSIS — I350 Nonrheumatic aortic (valve) stenosis: Secondary | ICD-10-CM | POA: Diagnosis not present

## 2023-11-25 MED ORDER — SODIUM CHLORIDE 0.9 % IV SOLN: INTRAVENOUS | Status: DC

## 2023-11-26 ENCOUNTER — Encounter
Admission: EM | Disposition: A | Discharge status: Home / Self Care | Payer: Self-pay | Source: Ambulatory Visit | Attending: Internal Medicine

## 2023-11-26 ENCOUNTER — Encounter: Payer: Self-pay | Admitting: Cardiovascular Disease

## 2023-11-26 ENCOUNTER — Inpatient Hospital Stay
Admission: EM | Admit: 2023-11-26 | Discharge: 2023-11-28 | DRG: 229 | Disposition: A | Discharge status: Home / Self Care | Attending: Internal Medicine | Admitting: Internal Medicine

## 2023-11-26 ENCOUNTER — Encounter: Payer: Self-pay | Admitting: Anesthesiology

## 2023-11-26 DIAGNOSIS — R54 Age-related physical debility: Secondary | ICD-10-CM | POA: Diagnosis present

## 2023-11-26 DIAGNOSIS — I4439 Other atrioventricular block: Secondary | ICD-10-CM | POA: Diagnosis not present

## 2023-11-26 DIAGNOSIS — E119 Type 2 diabetes mellitus without complications: Secondary | ICD-10-CM | POA: Diagnosis present

## 2023-11-26 DIAGNOSIS — I480 Paroxysmal atrial fibrillation: Secondary | ICD-10-CM | POA: Diagnosis not present

## 2023-11-26 DIAGNOSIS — Z888 Allergy status to other drugs, medicaments and biological substances status: Secondary | ICD-10-CM | POA: Diagnosis not present

## 2023-11-26 DIAGNOSIS — I10 Essential (primary) hypertension: Secondary | ICD-10-CM | POA: Diagnosis present

## 2023-11-26 DIAGNOSIS — E785 Hyperlipidemia, unspecified: Secondary | ICD-10-CM | POA: Diagnosis present

## 2023-11-26 DIAGNOSIS — I442 Atrioventricular block, complete: Principal | ICD-10-CM | POA: Diagnosis present

## 2023-11-26 DIAGNOSIS — Z7984 Long term (current) use of oral hypoglycemic drugs: Secondary | ICD-10-CM | POA: Diagnosis not present

## 2023-11-26 DIAGNOSIS — I350 Nonrheumatic aortic (valve) stenosis: Secondary | ICD-10-CM | POA: Diagnosis not present

## 2023-11-26 DIAGNOSIS — Z955 Presence of coronary angioplasty implant and graft: Secondary | ICD-10-CM | POA: Diagnosis not present

## 2023-11-26 DIAGNOSIS — Z79899 Other long term (current) drug therapy: Secondary | ICD-10-CM

## 2023-11-26 DIAGNOSIS — I4891 Unspecified atrial fibrillation: Secondary | ICD-10-CM | POA: Diagnosis present

## 2023-11-26 DIAGNOSIS — I483 Typical atrial flutter: Secondary | ICD-10-CM | POA: Diagnosis not present

## 2023-11-26 DIAGNOSIS — I779 Disorder of arteries and arterioles, unspecified: Secondary | ICD-10-CM | POA: Diagnosis present

## 2023-11-26 DIAGNOSIS — Z006 Encounter for examination for normal comparison and control in clinical research program: Secondary | ICD-10-CM

## 2023-11-26 DIAGNOSIS — I4892 Unspecified atrial flutter: Secondary | ICD-10-CM | POA: Diagnosis not present

## 2023-11-26 DIAGNOSIS — Z7982 Long term (current) use of aspirin: Secondary | ICD-10-CM | POA: Diagnosis not present

## 2023-11-26 DIAGNOSIS — Z974 Presence of external hearing-aid: Secondary | ICD-10-CM | POA: Diagnosis not present

## 2023-11-26 DIAGNOSIS — K219 Gastro-esophageal reflux disease without esophagitis: Secondary | ICD-10-CM | POA: Diagnosis present

## 2023-11-26 DIAGNOSIS — Z66 Do not resuscitate: Secondary | ICD-10-CM | POA: Diagnosis present

## 2023-11-26 DIAGNOSIS — Z950 Presence of cardiac pacemaker: Secondary | ICD-10-CM | POA: Diagnosis not present

## 2023-11-26 DIAGNOSIS — Z7901 Long term (current) use of anticoagulants: Secondary | ICD-10-CM

## 2023-11-26 DIAGNOSIS — R001 Bradycardia, unspecified: Secondary | ICD-10-CM | POA: Diagnosis not present

## 2023-11-26 DIAGNOSIS — I441 Atrioventricular block, second degree: Secondary | ICD-10-CM | POA: Diagnosis not present

## 2023-11-26 DIAGNOSIS — Z87891 Personal history of nicotine dependence: Secondary | ICD-10-CM

## 2023-11-26 DIAGNOSIS — I2510 Atherosclerotic heart disease of native coronary artery without angina pectoris: Secondary | ICD-10-CM | POA: Diagnosis present

## 2023-11-26 DIAGNOSIS — Z9861 Coronary angioplasty status: Secondary | ICD-10-CM | POA: Diagnosis not present

## 2023-11-26 HISTORY — PX: CARDIOVERSION: SHX1299

## 2023-11-26 LAB — CBC
HCT: 31.9 % — ABNORMAL LOW (ref 39.0–52.0)
Hemoglobin: 10.9 g/dL — ABNORMAL LOW (ref 13.0–17.0)
MCH: 29.4 pg (ref 26.0–34.0)
MCHC: 34.2 g/dL (ref 30.0–36.0)
MCV: 86 fL (ref 80.0–100.0)
Platelets: 120 K/uL — ABNORMAL LOW (ref 150–400)
RBC: 3.71 MIL/uL — ABNORMAL LOW (ref 4.22–5.81)
RDW: 14 % (ref 11.5–15.5)
WBC: 3.5 K/uL — ABNORMAL LOW (ref 4.0–10.5)
nRBC: 0 % (ref 0.0–0.2)

## 2023-11-26 LAB — BASIC METABOLIC PANEL WITH GFR
Anion gap: 11 (ref 5–15)
BUN: 22 mg/dL (ref 8–23)
CO2: 29 mmol/L (ref 22–32)
Calcium: 9.6 mg/dL (ref 8.9–10.3)
Chloride: 103 mmol/L (ref 98–111)
Creatinine, Ser: 1.02 mg/dL (ref 0.61–1.24)
GFR, Estimated: >60 mL/min (ref >60)
Glucose, Bld: 202 mg/dL — ABNORMAL HIGH (ref 70–99)
Potassium: 4.4 mmol/L (ref 3.5–5.1)
Sodium: 143 mmol/L (ref 135–145)

## 2023-11-26 LAB — GLUCOSE, CAPILLARY
Glucose-Capillary: 134 mg/dL — ABNORMAL HIGH (ref 70–99)
Glucose-Capillary: 233 mg/dL — ABNORMAL HIGH (ref 70–99)
Glucose-Capillary: 119 mg/dL — ABNORMAL HIGH (ref 70–99)

## 2023-11-26 LAB — HEMOGLOBIN A1C
Hgb A1c MFr Bld: 7.4 % — ABNORMAL HIGH (ref 4.8–5.6)
Mean Plasma Glucose: 166 mg/dL

## 2023-11-26 LAB — APTT: aPTT: 41 s — ABNORMAL HIGH (ref 24–36)

## 2023-11-26 LAB — HEPARIN LEVEL (UNFRACTIONATED): Heparin Unfractionated: >1.1 [IU]/mL — ABNORMAL HIGH (ref 0.30–0.70)

## 2023-11-26 LAB — TSH: TSH: 1.638 u[IU]/mL (ref 0.350–4.500)

## 2023-11-26 SURGERY — CARDIOVERSION
Anesthesia: General

## 2023-11-26 MED ORDER — PROPOFOL 10 MG/ML IV BOLUS
INTRAVENOUS | Status: AC
  Filled 2023-11-26: qty 20

## 2023-11-26 MED ORDER — HYDRALAZINE HCL 20 MG/ML IJ SOLN
10.0000 mg | Freq: Once | INTRAMUSCULAR | Status: AC
  Administered 2023-11-26: 10 mg via INTRAVENOUS
  Filled 2023-11-26: qty 1

## 2023-11-26 MED ORDER — HEPARIN (PORCINE) 25000 UT/250ML-% IV SOLN
1200.0000 [IU]/h | INTRAVENOUS | Status: AC
  Administered 2023-11-26: 1200 [IU]/h via INTRAVENOUS
  Filled 2023-11-26: qty 250

## 2023-11-26 MED ORDER — ONDANSETRON HCL 4 MG PO TABS: 4.0000 mg | ORAL_TABLET | Freq: Four times a day (QID) | ORAL | Status: DC | PRN

## 2023-11-26 MED ORDER — ACETAMINOPHEN 325 MG PO TABS
650.0000 mg | ORAL_TABLET | Freq: Four times a day (QID) | ORAL | Status: DC | PRN
Start: 2023-11-26 — End: 2023-11-27
  Administered 2023-11-26: 650 mg via ORAL
  Filled 2023-11-26: qty 2

## 2023-11-26 MED ORDER — INSULIN ASPART 100 UNIT/ML IJ SOLN
0.0000 [IU] | Freq: Three times a day (TID) | INTRAMUSCULAR | Status: DC
  Administered 2023-11-26: 3 [IU] via SUBCUTANEOUS
  Administered 2023-11-27: 1 [IU] via SUBCUTANEOUS
  Administered 2023-11-27 – 2023-11-28 (×2): 2 [IU] via SUBCUTANEOUS
  Filled 2023-11-26 (×4): qty 1

## 2023-11-26 MED ORDER — TAMSULOSIN HCL 0.4 MG PO CAPS
0.8000 mg | ORAL_CAPSULE | Freq: Every day | ORAL | Status: DC
  Administered 2023-11-26 – 2023-11-27 (×2): 0.8 mg via ORAL
  Filled 2023-11-26 (×2): qty 2

## 2023-11-26 MED ORDER — ACETAMINOPHEN 650 MG RE SUPP: 650.0000 mg | Freq: Four times a day (QID) | RECTAL | Status: DC | PRN

## 2023-11-26 MED ORDER — SODIUM CHLORIDE 0.9 % IV SOLN: INTRAVENOUS | Status: DC

## 2023-11-26 MED ORDER — CHLORHEXIDINE GLUCONATE CLOTH 2 % EX PADS
6.0000 | MEDICATED_PAD | Freq: Every day | CUTANEOUS | Status: DC
  Administered 2023-11-27: 6 via TOPICAL

## 2023-11-26 MED ORDER — ONDANSETRON HCL 4 MG/2ML IJ SOLN: 4.0000 mg | Freq: Four times a day (QID) | INTRAMUSCULAR | Status: DC | PRN

## 2023-11-26 MED ORDER — INSULIN ASPART 100 UNIT/ML IJ SOLN
0.0000 [IU] | Freq: Every day | INTRAMUSCULAR | Status: DC
  Administered 2023-11-27: 3 [IU] via SUBCUTANEOUS
  Filled 2023-11-26: qty 1

## 2023-11-26 MED ORDER — POLYETHYLENE GLYCOL 3350 17 G PO PACK: 17.0000 g | PACK | Freq: Every day | ORAL | Status: DC | PRN

## 2023-11-26 NOTE — Consult Note (Signed)
 PHARMACY - ANTICOAGULATION CONSULT NOTE  Pharmacy Consult for :Heparin  infusion Indication: atrial fibrillation  Allergies  Allergen Reactions   Hydrochlorothiazide     Muscle cramps   Lisinopril Cough   Lovastatin     Muscle pain    Patient Measurements: Height: 6' (182.9 cm) Weight: 83.9 kg (185 lb) IBW/kg (Calculated) : 77.6 HEPARIN  DW (KG): 83.9  Vital Signs: Temp: 98.3 F (36.8 C) (08/14 1417) Temp Source: Oral (08/14 1417) BP: 143/84 (08/14 1417) Pulse Rate: 37 (08/14 1417)  Labs: Recent Labs    11/26/23 1416 11/26/23 1417  HGB 10.9*  --   HCT 31.9*  --   PLT 120*  --   APTT 41*  --   HEPARINUNFRC >1.10*  --   CREATININE  --  1.02    Estimated Creatinine Clearance: 52.8 mL/min (by C-G formula based on SCr of 1.02 mg/dL).   Medical History: Past Medical History:  Diagnosis Date   Aortic valve stenosis    Carotid artery stenosis    Coronary artery disease    Diabetes mellitus without complication (HCC)    GERD (gastroesophageal reflux disease)    Hypertension    Neuropathy    feet   Wears hearing aid in both ears     Medications:  Patient on Apixaban  5mg  po BID PTA, last dose 8/14 in AM  Assessment: 89yo male with PMH including Afib, aortic stenosis, DM, HTN, GERD. Admitted with complete heart block, Pharmacy consulted to start and manage heparin  infusion.   Plan pacemaker placement tomorrow 8/15. Baseline labs appropriate to start heparin  infusion with HL >1.10 as expected with apixaban  usage.  Goal of Therapy:  Heparin  level 0.3-0.7 units/ml aPTT 66-102 seconds Monitor platelets by anticoagulation protocol: Yes   Plan:  No BOLUS due to Apixaban  5mg  po this morning Start heparin  infusion at 1200 units/hr (8/14 @1800 ) Check apTT 8 hours after infusion started and daily while on heparin  Will use aPTT until correlating with HL Continue to monitor H&H and platelets  Thania Woodlief Rodriguez-Guzman PharmD, BCPS 11/26/2023 3:27 PM

## 2023-11-26 NOTE — H&P (Deleted)
 Pre-procedure History & Physical    Patient ID: Eduardo Ashley MRN: [id number]; DOB: [birth year]   Date of procedure: 11/26/2023  Primary Care Provider: Jonette Lauraine BRAVO, PA-C Primary Cardiologist: None   Planned procedure:  DCCV  HPI:   Eduardo Ashley is a 89 y.o. male with h/o AF s/p ablation, now w/ atrial flutter, here for elective DCCV  Past Medical History:  Diagnosis Date   Aortic valve stenosis    Carotid artery stenosis    Coronary artery disease    Diabetes mellitus without complication (HCC)    GERD (gastroesophageal reflux disease)    Hypertension    Neuropathy    feet   Wears hearing aid in both ears     Past Surgical History:  Procedure Laterality Date   LEFT HEART CATH AND CORONARY ANGIOGRAPHY Left 06/25/2017   Procedure: LEFT HEART CATH AND CORONARY ANGIOGRAPHY;  Surgeon: Hester Wolm PARAS, MD;  Location: [facility] INVASIVE CV LAB;  Service: Cardiovascular;  Laterality: Left;     Medications Prior to Admission: Prior to Admission medications   Medication Sig Start Date End Date Taking? Authorizing Provider  acetaminophen  (TYLENOL ) 500 MG tablet Take 1,000 mg by mouth every 6 (six) hours as needed for moderate pain or headache.   Yes [provider]  apixaban  (ELIQUIS ) 5 MG TABS tablet Take 5 mg by mouth 2 (two) times daily.   Yes [provider]  Ascorbic Acid (VITAMIN C PO) Take 1,000 mg by mouth daily. 500 mg   Yes [provider]  aspirin  EC 81 MG tablet Take 81 mg by mouth daily.   Yes [provider]  cholecalciferol (VITAMIN D3) 25 MCG (1000 UNIT) tablet Take 1,000 Units by mouth daily.   Yes [provider]  finasteride (PROSCAR) 5 MG tablet Take 5 mg by mouth daily.   Yes [provider]  gabapentin (NEURONTIN) 600 MG tablet Take 600-1,200 mg by mouth See admin instructions. Take 600 mg by mouth in the morning and take 1200 mg by mouth at bedtime   Yes [provider]  glimepiride (AMARYL) 2 MG  tablet Take 2-4 mg by mouth See admin instructions. Take 4 mg at breakfast and 2 mg at dinner   Yes [provider]  irbesartan  (AVAPRO ) 300 MG tablet Take 300 mg by mouth daily.   Yes [provider]  magnesium  oxide (MAG-OX) 400 MG tablet Take 400 mg by mouth daily.   Yes [provider]  metFORMIN (GLUCOPHAGE) 1000 MG tablet Take 1,000 mg by mouth 2 (two) times daily with a meal.   Yes [provider]  pravastatin  (PRAVACHOL ) 40 MG tablet Take 40 mg by mouth at bedtime.   Yes [provider]  sitaGLIPtin (JANUVIA) 100 MG tablet Take 100 mg by mouth daily.   Yes [provider]  tamsulosin  (FLOMAX ) 0.4 MG CAPS capsule Take 0.8 mg by mouth at bedtime.   Yes [provider]  vitamin B-12 (CYANOCOBALAMIN) 500 MCG tablet Take 500 mcg by mouth daily.   Yes [provider]  metoprolol  succinate (TOPROL -XL) 25 MG 24 hr tablet Take 25 mg by mouth daily. Patient not taking: Reported on 11/24/2023    [provider]     Allergies:    Allergies  Allergen Reactions   Hydrochlorothiazide     Muscle cramps   Lisinopril Cough   Lovastatin     Muscle pain    Social History:   Social History   Socioeconomic History  Marital status: Widowed    Spouse name: Not on file   Number of children: Not on file   Years of education: Not on file   Highest education level: Not on file  Occupational History   Not on file  Tobacco Use   Smoking status: Former    Current packs/day: 0.00    Types: Cigarettes    Quit date: 47    Years since quitting: 56.6   Smokeless tobacco: Never  Vaping Use   Vaping status: Never Used  Substance and Sexual Activity   Alcohol use: No   Drug use: Not on file   Sexual activity: Not on file  Other Topics Concern   Not on file  Social History Narrative   Not on file   Social Drivers of Health   Financial Resource Strain: Low Risk  (02/13/2023)   Received from [facility]   Overall Financial Resource Strain (CARDIA)    Difficulty of Paying Living Expenses: Not very hard  Food Insecurity: No Food Insecurity (02/13/2023)   Received from [facility]   Hunger Vital Sign    Within the past 12 months, you worried that your food would run out before you got the money to buy more.: Never true    Within the past 12 months, the food you bought just didn't last and you didn't have money to get more.: Never true  Transportation Needs: No Transportation Needs (02/13/2023)   Received from [facility]    In the past 12 months, has lack of transportation kept you from medical appointments or from getting medications?: No    Lack of Transportation (Non-Medical): No  Physical Activity: Not on file  Stress: Not on file  Social Connections: Not on file  Intimate Partner Violence: Not on file     Family History:   The patient's family history is not on file.    ROS:  Please see the history of present illness.  All other ROS reviewed and negative.     Physical Exam/Data:   Vitals:   11/26/23 1114  BP: (!) 159/63  Pulse: (!) 42  Resp: 20  TempSrc: Oral  SpO2: 97%  Weight: 83.9 kg  Height: 6' (1.829 m)   No intake or output data in the 24 hours ending 11/26/23 1122    11/26/2023   11:14 AM 02/06/2020   12:02 PM 06/25/2017    6:39 AM  Last 3 Weights  Weight (lbs) 185 lb 188 lb 185 lb  Weight (kg) 83.915 kg 85.276 kg 83.915 kg     Body mass index is 25.09 kg/m.  Wt Readings from Last 3 Encounters:  11/26/23 83.9 kg  06/25/17 83.9 kg    Physical Exam: General: no acute distress. Head: Normocephalic, atraumatic  Neck: supple Lungs: nl effort Heart: irreg irreg Abdomen: Soft Msk:  Strength and tone appear normal for age. Extremities: warm, dry Neuro: awake and alert Psych:  Responds to questions appropriately with a normal affect.    Laboratory Data:  ChemistryNo results for  input(s): NA, K, CL, CO2, GLUCOSE, BUN, CREATININE, CALCIUM, GFRNONAA, GFRAA, ANIONGAP in the last 168 hours.  No results for input(s): PROT, ALBUMIN, AST, ALT, ALKPHOS, BILITOT in the last 168 hours. HematologyNo results for input(s): WBC, RBC, HGB, HCT, MCV, MCH, MCHC, RDW, PLT in the last 168 hours.  Assessment and Plan   Will proceed w/ DCCV  ASA III  Anesthesia to be administered  by CRNA  Signed, DEARL LEAVEN, MD 11/26/2023, 11:22 AM

## 2023-11-26 NOTE — Care Management Important Message (Signed)
 Important Message  Patient Details  Name: Eduardo Ashley MRN: [id number] Date of Birth: [birth year]   Important Message Given:  Yes - Medicare IM     [name] 11/26/2023, 4:45 PM

## 2023-11-26 NOTE — Consult Note (Signed)
 [facility] CLINIC CARDIOLOGY CONSULT NOTE       Patient ID: LORANCE PICKERAL MRN: [id number] DOB/AGE: May 01, 1930 89 y.o.  Admit date: 11/26/2023 Referring Physician Dr. Roann Primary Physician Jonette Lauraine BRAVO, PA-C Primary Cardiologist Dr. Jessee Dr. Dewane Reason for Consultation complete heart block  HPI: JAICEON COLLISTER is a 89 y.o. male  with a past medical history of severe aortic stenosis, history of second-degree AV block Mobitz type I, paroxsymal atrial fibrillation/flutter s/p DCCV and ablation, coronary artery disease s/p stent to LAD (01/2011), hypertension, hyperlipidemia, type 2 diabetes, carotid artery disease who presented for outpatient DCCV for atrial flutter on 11/26/2023.  Patient's preoperative EKG and telemetry revealed complete heart block with rates in the 30s.  Patient states he has had lightheadedness for the past year that has worsened more recently.  Patient is without chest pain or SOB.  Of note patient is in the process of being worked up for TAVR for severe aortic stenosis with Dr. Katina.  Cardiology was consulted for further evaluation.   CBC, BMP, TSH ordered.  EKG with complete heart block, rate 31 bpm.  At the time of my evaluation this afternoon, patient was resting comfortably in specials recovery unit with family at bedside.  We discussed patient's symptoms in further detail.  Patient states he been feeling lightheaded whenever he gets up for the past year, this has been getting worse more recently.  Patient states normally when he is laying down resting he has no symptoms.  Patient denies any chest pain, palpitations, SOB or syncopal events.  Patient states currently he feels good without any cardiac sxs. BP stable. HR 30-40s in complete heart block.   Review of systems complete and found to be negative unless listed above    Past Medical History:  Diagnosis Date   Aortic valve stenosis    Carotid artery stenosis    Coronary artery disease    Diabetes  mellitus without complication (HCC)    GERD (gastroesophageal reflux disease)    Hypertension    Neuropathy    feet   Wears hearing aid in both ears     Past Surgical History:  Procedure Laterality Date   LEFT HEART CATH AND CORONARY ANGIOGRAPHY Left 06/25/2017   Procedure: LEFT HEART CATH AND CORONARY ANGIOGRAPHY;  Surgeon: Hester Wolm PARAS, MD;  Location: [facility] INVASIVE CV LAB;  Service: Cardiovascular;  Laterality: Left;    Medications Prior to Admission  Medication Sig Dispense Refill Last Dose/Taking   acetaminophen  (TYLENOL ) 500 MG tablet Take 1,000 mg by mouth every 6 (six) hours as needed for moderate pain or headache.   11/25/2023   apixaban  (ELIQUIS ) 5 MG TABS tablet Take 5 mg by mouth 2 (two) times daily.   11/26/2023   Ascorbic Acid (VITAMIN C PO) Take 1,000 mg by mouth daily. 500 mg   Taking   aspirin  EC 81 MG tablet Take 81 mg by mouth daily.   11/25/2023   cholecalciferol (VITAMIN D3) 25 MCG (1000 UNIT) tablet Take 1,000 Units by mouth daily.   11/25/2023   finasteride (PROSCAR) 5 MG tablet Take 5 mg by mouth daily.   11/25/2023   gabapentin (NEURONTIN) 600 MG tablet Take 600-1,200 mg by mouth See admin instructions. Take 600 mg by mouth in the morning and take 1200 mg by mouth at bedtime   11/25/2023   glimepiride (AMARYL) 2 MG tablet Take 2-4 mg by mouth See admin instructions. Take 4 mg at breakfast and 2 mg at dinner  11/25/2023   irbesartan  (AVAPRO ) 300 MG tablet Take 300 mg by mouth daily.   11/25/2023   magnesium  oxide (MAG-OX) 400 MG tablet Take 400 mg by mouth daily.   11/25/2023   metFORMIN (GLUCOPHAGE) 1000 MG tablet Take 1,000 mg by mouth 2 (two) times daily with a meal.   11/25/2023   pravastatin  (PRAVACHOL ) 40 MG tablet Take 40 mg by mouth at bedtime.   11/25/2023   sitaGLIPtin (JANUVIA) 100 MG tablet Take 100 mg by mouth daily.   11/25/2023   tamsulosin  (FLOMAX ) 0.4 MG CAPS capsule Take 0.8 mg by mouth at bedtime.   11/25/2023   vitamin B-12 (CYANOCOBALAMIN) 500 MCG  tablet Take 500 mcg by mouth daily.   11/25/2023   metoprolol  succinate (TOPROL -XL) 25 MG 24 hr tablet Take 25 mg by mouth daily. (Patient not taking: Reported on 11/24/2023)   Not Taking   Social History   Socioeconomic History   Marital status: Widowed    Spouse name: Not on file   Number of children: Not on file   Years of education: Not on file   Highest education level: Not on file  Occupational History   Not on file  Tobacco Use   Smoking status: Former    Current packs/day: 0.00    Types: Cigarettes    Quit date: 39    Years since quitting: 56.6   Smokeless tobacco: Never  Vaping Use   Vaping status: Never Used  Substance and Sexual Activity   Alcohol use: No   Drug use: Not on file   Sexual activity: Not on file  Other Topics Concern   Not on file  Social History Narrative   Not on file   Social Drivers of Health   Financial Resource Strain: Low Risk  (02/13/2023)   Received from [facility]   Overall Financial Resource Strain (CARDIA)    Difficulty of Paying Living Expenses: Not very hard  Food Insecurity: No Food Insecurity (02/13/2023)   Received from [facility]   Hunger Vital Sign    Within the past 12 months, you worried that your food would run out before you got the money to buy more.: Never true    Within the past 12 months, the food you bought just didn't last and you didn't have money to get more.: Never true  Transportation Needs: No Transportation Needs (02/13/2023)   Received from [facility]    In the past 12 months, has lack of transportation kept you from medical appointments or from getting medications?: No    Lack of Transportation (Non-Medical): No  Physical Activity: Not on file  Stress: Not on file  Social Connections: Not on file  Intimate Partner Violence: Not on file    History reviewed. No pertinent family history.   Vitals:   11/26/23 1114 11/26/23  1154 11/26/23 1200  BP: (!) 159/63  (!) 156/64  Pulse: (!) 42 (!) 32 (!) 29  Resp: 20 14 15   TempSrc: Oral    SpO2: 97% 96% 97%  Weight: 83.9 kg    Height: 6' (1.829 m)      PHYSICAL EXAM General: Well-appearing elderly male, well nourished, in no acute distress. HEENT: Normocephalic and atraumatic. Neck: No JVD.   Lungs: Normal respiratory effort on room air. Clear bilaterally to auscultation. No wheezes, crackles, rhonchi.  Heart: HRR, slow rate. Normal S1 and S2, + systolic murmur.  Abdomen: Non-distended appearing.  Msk: Normal  strength and tone for age. Extremities: Warm and well perfused. No clubbing, cyanosis, edema.  Neuro: Alert and oriented X 3. Psych: Answers questions appropriately.   Labs: Basic Metabolic Panel: No results for input(s): NA, K, CL, CO2, GLUCOSE, BUN, CREATININE, CALCIUM, MG, PHOS in the last 72 hours. Liver Function Tests: No results for input(s): AST, ALT, ALKPHOS, BILITOT, PROT, ALBUMIN in the last 72 hours. No results for input(s): LIPASE, AMYLASE in the last 72 hours. CBC: No results for input(s): WBC, NEUTROABS, HGB, HCT, MCV, PLT in the last 72 hours. Cardiac Enzymes: No results for input(s): CKTOTAL, CKMB, CKMBINDEX, TROPONINIHS in the last 72 hours. BNP: No results for input(s): BNP in the last 72 hours. D-Dimer: No results for input(s): DDIMER in the last 72 hours. Hemoglobin A1C: No results for input(s): HGBA1C in the last 72 hours. Fasting Lipid Panel: No results for input(s): CHOL, HDL, LDLCALC, TRIG, CHOLHDL, LDLDIRECT in the last 72 hours. Thyroid  Function Tests: No results for input(s): TSH, T4TOTAL, T3FREE, THYROIDAB in the last 72 hours.  Invalid input(s): FREET3 Anemia Panel: No results for input(s): VITAMINB12, FOLATE, FERRITIN, TIBC, IRON, RETICCTPCT in the last 72 hours.   Radiology: No results found.  ECHO  09/10/2023 NORMAL LEFT VENTRICULAR SYSTOLIC FUNCTION WITH MODERATE LVH  ESTIMATED EF: >55%  NORMAL LA PRESSURES WITH DIASTOLIC DYSFUNCTION (GRADE 1)  NORMAL RIGHT VENTRICULAR SYSTOLIC FUNCTION  VALVULAR REGURGITATION: No AR, MODERATE MR, No PR, MILD TR  ESTIMATED RVSP: 44 mmHg  VALVULAR STENOSIS: SEVERE AS, No MS, No PS, No TS   TELEMETRY reviewed by me 11/26/2023: complete heart block, rate 30s   EKG reviewed by me: Complete heart block, 31 bpm   Data reviewed by me 11/26/2023: last 24h vitals tele labs imaging I/O hospitalist H &P.  Principal Problem:   Complete heart block (HCC) Active Problems:   Atrial flutter (HCC)   DM (diabetes mellitus) (HCC)   HTN (hypertension)   HLD (hyperlipidemia)    ASSESSMENT AND PLAN:  TRA WILEMON is a 89 y.o. male  with a past medical history of severe aortic stenosis, history of second-degree AV block Mobitz type I, paroxsymal atrial fibrillation/flutter s/p DCCV and ablation, coronary artery disease s/p stent to LAD (01/2011), hypertension, hyperlipidemia, type 2 diabetes, carotid artery disease who presented for outpatient DCCV for atrial flutter on 11/26/2023.  Patient's preoperative EKG and telemetry revealed complete heart block with rates in the 30s.  Patient states he has had lightheadedness for the past year that has worsened more recently.  Patient is without chest pain or SOB.  Of note patient is in the process of being worked up for TAVR for severe aortic stenosis with Dr. Katina.  Cardiology was consulted for further evaluation.   # Complete heart block # Paroxsymal atrial fibrillation/flutter  EKG and telemetry with complete heart block rate 30s.  Prior EKG from 08/2023 with preserved EF. -Avoid all AV nodal blockers. -Place external pacing pads. -Plan for leadless Micra PPM on 08/15 [place]. -Discussed the risks and benefits of proceeding with PPM for complete heart block He is agreeable to proceed.  NPO at midnight until  procedure(08/14 at 10:45 AM) with Dr. Ammon.  Written consent will be obtained.  -Continue IV heparin  drip for stroke risk reduction, will discontinue 2 hours prior to procedure (heparin  ordered to be discontinued at 0830 on 08/15). Plan to resume home PO Eliquis  after procedure if groin incision site and Hgb stable.   # Severe Aortic Stenosis Patient is being evaluated by Dr.  Pride regarding TAVR. CT TAVR imaging ordered/pending.  -Follow-up outpatient with Dr. Katina  # Coronary artery disease s/p stent to LAD (2012) # Hypertension # Hyperlipidemia -Continue home aspirin  81 mg, pravastatin  40 mg daily. -Continue home irbesartan  300 mg daily. -Will avoid beta-blocker at this time due to complete heart block.  This patient's plan of care was discussed and created with Dr. Ammon and he is in agreement.  Signed: Dorene Comfort, PA-C  11/26/2023, 1:49 PM [facility]

## 2023-11-26 NOTE — H&P (Signed)
 History and Physical    ROAN MIKLOS FMW:[id number] DOB: [birth year] DOA: 11/26/2023  DOS: the patient was seen and examined on 11/26/2023  PCP: Jonette Lauraine BRAVO, PA-C   Patient coming from: Home  I have personally briefly reviewed patient's old medical records in [facility]  Chief Complaint: Dizzy and tired  HPI: CHAZE HRUSKA is a pleasant 89 y.o. male with medical history significant for atrial fibrillation electively brought in for cardioversion, aortic stenosis being worked up for possible TAVR, DM, HTN, GERD who was brought into outpatient procedure area for elective cardioversion.  Upon admission to procedure area, patient's EKG was noted to have complete heart block.  Cardiology evaluated the patient and they requested for hospitalist evaluation for admission for possible pacemaker. Patient stated that he has been feeling tired and dizzy for the last month or so.  It has been gradually worsening.  He has seen his cardiologist and they brought him for elective cardioversion today.  He denies any chest pain but he complains of exertional shortness of breath while he feels dizzy.  He denies any syncopal or presyncopal episode.  He denies any nausea vomiting diarrhea.  He denies any fever or chills.  ED Course: Patient was brought into outpatient procedure area not in the ED.  As mentioned above patient was found to be bradycardia in the 30s heart rate and patient complained of dizziness and lightheadedness.  EKG showed complete heart block.  Cardiologist advised admission for possible pacemaker tomorrow.  Review of Systems:  ROS  All other systems negative except as noted in the HPI.  Past Medical History:  Diagnosis Date   Aortic valve stenosis    Carotid artery stenosis    Coronary artery disease    Diabetes mellitus without complication (HCC)    GERD (gastroesophageal reflux disease)    Hypertension    Neuropathy    feet   Wears hearing aid in both ears     Past  Surgical History:  Procedure Laterality Date   LEFT HEART CATH AND CORONARY ANGIOGRAPHY Left 06/25/2017   Procedure: LEFT HEART CATH AND CORONARY ANGIOGRAPHY;  Surgeon: Hester Wolm PARAS, MD;  Location: [facility] INVASIVE CV LAB;  Service: Cardiovascular;  Laterality: Left;     reports that he quit smoking about 56 years ago. His smoking use included cigarettes. He has never used smokeless tobacco. He reports that he does not drink alcohol. No history on file for drug use.  Allergies  Allergen Reactions   Hydrochlorothiazide     Muscle cramps   Lisinopril Cough   Lovastatin     Muscle pain    History reviewed. No pertinent family history.  Prior to Admission medications   Medication Sig Start Date End Date Taking? Authorizing Provider  acetaminophen  (TYLENOL ) 500 MG tablet Take 1,000 mg by mouth every 6 (six) hours as needed for moderate pain or headache.   Yes [provider]  apixaban  (ELIQUIS ) 5 MG TABS tablet Take 5 mg by mouth 2 (two) times daily.   Yes [provider]  Ascorbic Acid (VITAMIN C PO) Take 1,000 mg by mouth daily. 500 mg   Yes [provider]  aspirin  EC 81 MG tablet Take 81 mg by mouth daily.   Yes [provider]  cholecalciferol (VITAMIN D3) 25 MCG (1000 UNIT) tablet Take 1,000 Units by mouth daily.   Yes [provider]  finasteride (PROSCAR) 5 MG tablet Take 5 mg by mouth daily.   Yes [provider]  gabapentin (NEURONTIN) 600 MG tablet Take 600-1,200 mg by mouth See admin instructions. Take 600 mg by mouth in the morning and take 1200 mg by mouth at bedtime   Yes [provider]  glimepiride (AMARYL) 2 MG tablet Take 2-4 mg by mouth See admin instructions. Take 4 mg at breakfast and 2 mg at dinner   Yes [provider]  irbesartan  (AVAPRO ) 300 MG tablet Take 300 mg by mouth daily.   Yes [provider]  magnesium  oxide (MAG-OX) 400 MG tablet Take 400 mg by mouth daily.   Yes [provider]  metFORMIN (GLUCOPHAGE) 1000 MG tablet Take 1,000 mg by mouth 2 (two) times daily with a meal.   Yes [provider]  pravastatin  (PRAVACHOL ) 40 MG tablet Take 40 mg by mouth at bedtime.   Yes [provider]  sitaGLIPtin (JANUVIA) 100 MG tablet Take 100 mg by mouth daily.   Yes [provider]  tamsulosin  (FLOMAX ) 0.4 MG CAPS capsule Take 0.8 mg by mouth at bedtime.   Yes [provider]  vitamin B-12 (CYANOCOBALAMIN) 500 MCG tablet Take 500 mcg by mouth daily.   Yes [provider]  metoprolol  succinate (TOPROL -XL) 25 MG 24 hr tablet Take 25 mg by mouth daily. Patient not taking: Reported on 11/24/2023    [provider]    Physical Exam: Vitals:   11/26/23 1114 11/26/23 1154 11/26/23 1200  BP: (!) 159/63  (!) 156/64  Pulse: (!) 42 (!) 32 (!) 29  Resp: 20 14 15   TempSrc: Oral    SpO2: 97% 96% 97%  Weight: 83.9 kg    Height: 6' (1.829 m)      Physical Exam   Constitutional: Alert, awake, calm, comfortable HEENT: Neck supple Respiratory: Clear to auscultation B/L, no wheezing, no rales.  Cardiovascular: Regular rate and rhythm, no murmurs / rubs / gallops. No extremity edema. 2+ pedal pulses. No carotid bruits.  Abdomen: Soft, no tenderness, Bowel sounds positive.  Musculoskeletal: no clubbing / cyanosis. Good ROM, no contractures. Normal muscle tone.  Skin: no rashes, lesions, ulcers. Neurologic: CN 2-12 grossly intact. Sensation intact, No focal deficit identified Psychiatric: Alert and oriented x 3. Normal mood.    Labs on Admission: I have personally reviewed following labs and imaging studies  CBC: No results for input(s): WBC, NEUTROABS, HGB, HCT, MCV, PLT in the last 168 hours. Basic Metabolic Panel: No results for input(s): NA, K, CL, CO2, GLUCOSE, BUN, CREATININE, CALCIUM, MG, PHOS in the last 168 hours. GFR: CrCl cannot be calculated (Patient's most recent lab result  is older than the maximum 21 days allowed.). Liver Function Tests: No results for input(s): AST, ALT, ALKPHOS, BILITOT, PROT, ALBUMIN in the last 168 hours. No results for input(s): LIPASE, AMYLASE in the last 168 hours. No results for input(s): AMMONIA in the last 168 hours. Coagulation Profile: No results for input(s): INR, PROTIME in the last 168 hours. Cardiac Enzymes: No results for input(s): CKTOTAL, CKMB, CKMBINDEX, TROPONINI, TROPONINIHS in the last 168 hours. BNP (last 3 results) No results for input(s): BNP in the last 8760 hours. HbA1C: No results for input(s): HGBA1C in the last 72 hours. CBG: Recent Labs  Lab 11/26/23 1154  GLUCAP 134*   Lipid Profile: No results for input(s): CHOL, HDL, LDLCALC, TRIG, CHOLHDL, LDLDIRECT in the last 72 hours. Thyroid  Function Tests: No results for input(s): TSH, T4TOTAL, FREET4, T3FREE, THYROIDAB in the last 72 hours. Anemia Panel: No results for input(s): VITAMINB12, FOLATE, FERRITIN, TIBC,  IRON, RETICCTPCT in the last 72 hours. Urine analysis: No results found for: COLORURINE, APPEARANCEUR, LABSPEC, PHURINE, GLUCOSEU, HGBUR, BILIRUBINUR, KETONESUR, PROTEINUR, UROBILINOGEN, NITRITE, LEUKOCYTESUR  Radiological Exams on Admission: I have personally reviewed images No results found.  EKG: My personal interpretation of EKG shows: Complete heart block    Assessment/Plan Principal Problem:   Complete heart block (HCC) Active Problems:   Atrial flutter (HCC)   DM (diabetes mellitus) (HCC)   HTN (hypertension)   HLD (hyperlipidemia)    Assessment and Plan: 89 year old male with past medical history of atrial flutter being planned for outpatient cardioversion today, DM not on insulin , HTN, HLD who was brought into the outpatient procedure area for elective cardioversion.  He was found to have complete heart block.  1.  Complete heart  block - Patient complains of dizzy and weakness more than a week. - His EKG showed complete heart block. - Cardiology to evaluate the patient and the planning on doing the pacemaker likely tomorrow. - Patient will be admitted to the hospital as inpatient. - Telemetry monitoring, cardiology evaluation, possible pacemaker - Will hold off patient's beta-blocker.  2.  Atrial flutter - Patient was planned for outpatient cardioversion. - Cardioversion will be held off due to complete heart block. - Will hold off Eliquis  and place him on heparin  drip  3.  Aortic stenosis being planned for TAVR - Plan for outpatient cardiothoracic surgery evaluation. - Discussed with the patient's family son and brother they agreed with the plan.  4.  HTN/HLD - Stable - Continue home medications  5.  Type 2 diabetes - Patient takes Januvia, metformin and glimepiride at home. - Will hold off those oral hypoglycemic medication while he is in the hospital. - Will place him on insulin  sliding scale.    DVT prophylaxis: IV heparin  gtts Code Status: DNR/DNI(Do NOT Intubate) Family Communication: Patient, brother and son Disposition Plan: Home Consults called: Cardiology Admission status: Inpatient, Telemetry bed   Nena Rebel, MD [facility] 11/26/2023, 1:33 PM

## 2023-11-26 NOTE — Plan of Care (Signed)
  Problem: Education: Goal: Knowledge of General Education information will improve Description: Including pain rating scale, medication(s)/side effects and non-pharmacologic comfort measures Outcome: Progressing   Problem: Health Behavior/Discharge Planning: Goal: Ability to manage health-related needs will improve Outcome: Progressing   Problem: Clinical Measurements: Goal: Ability to maintain clinical measurements within normal limits will improve Outcome: Progressing Goal: Will remain free from infection Outcome: Progressing Goal: Diagnostic test results will improve Outcome: Progressing Goal: Respiratory complications will improve Outcome: Progressing Goal: Cardiovascular complication will be avoided Outcome: Progressing   Problem: Activity: Goal: Risk for activity intolerance will decrease Outcome: Progressing   Problem: Nutrition: Goal: Adequate nutrition will be maintained Outcome: Progressing   Problem: Coping: Goal: Level of anxiety will decrease Outcome: Progressing   Problem: Elimination: Goal: Will not experience complications related to bowel motility Outcome: Progressing Goal: Will not experience complications related to urinary retention Outcome: Progressing   Problem: Pain Managment: Goal: General experience of comfort will improve and/or be controlled Outcome: Progressing   Problem: Safety: Goal: Ability to remain free from injury will improve Outcome: Progressing   Problem: Skin Integrity: Goal: Risk for impaired skin integrity will decrease Outcome: Progressing   Problem: Education: Goal: Ability to describe self-care measures that may prevent or decrease complications (Diabetes Survival Skills Education) will improve Outcome: Progressing Goal: Individualized Educational Video(s) Outcome: Progressing   Problem: Coping: Goal: Ability to adjust to condition or change in health will improve Outcome: Progressing   Problem: Fluid  Volume: Goal: Ability to maintain a balanced intake and output will improve Outcome: Progressing   Problem: Health Behavior/Discharge Planning: Goal: Ability to identify and utilize available resources and services will improve Outcome: Progressing Goal: Ability to manage health-related needs will improve Outcome: Progressing   Problem: Metabolic: Goal: Ability to maintain appropriate glucose levels will improve Outcome: Progressing   Problem: Nutritional: Goal: Maintenance of adequate nutrition will improve Outcome: Progressing Goal: Progress toward achieving an optimal weight will improve Outcome: Progressing   Problem: Skin Integrity: Goal: Risk for impaired skin integrity will decrease Outcome: Progressing   Problem: Tissue Perfusion: Goal: Adequacy of tissue perfusion will improve Outcome: Progressing   Problem: Education: Goal: Knowledge of disease or condition will improve Outcome: Progressing Goal: Understanding of medication regimen will improve Outcome: Progressing Goal: Individualized Educational Video(s) Outcome: Progressing   Problem: Activity: Goal: Ability to tolerate increased activity will improve Outcome: Progressing   Problem: Cardiac: Goal: Ability to achieve and maintain adequate cardiopulmonary perfusion will improve Outcome: Progressing   Problem: Health Behavior/Discharge Planning: Goal: Ability to safely manage health-related needs after discharge will improve Outcome: Progressing

## 2023-11-26 NOTE — Progress Notes (Signed)
 Patient has remained clinically stable despite complete heart block rhythm, bp stable, to be admitted with pacemaker insertion planned for tomorrow. Report called to Dene Lesches Rn/248/telemetry with questions answered. Son also made aware of room number along with plan for tomorrow procedure. Patient also aware of plan and agreeable. Ate lunch, tolerated well. Transported to 248 per stretcher.

## 2023-11-26 NOTE — Anesthesia Preprocedure Evaluation (Signed)
 Anesthesia Evaluation  Patient identified by MRN, date of birth, ID band Patient awake    Reviewed: Allergy & Precautions, H&P , NPO status , Patient's Chart, lab work & pertinent test results, reviewed documented beta blocker date and time   History of Anesthesia Complications Negative for: history of anesthetic complications  Airway Mallampati: II  TM Distance: >3 FB Neck ROM: full    Dental no notable dental hx.    Pulmonary neg pulmonary ROS, former smoker   Pulmonary exam normal breath sounds clear to auscultation       Cardiovascular Exercise Tolerance: Good hypertension, + CAD  Normal cardiovascular exam Rhythm:regular Rate:Normal  ECHO 11/11/22: NORMAL LEFT VENTRICULAR SYSTOLIC FUNCTION   WITH MODERATE LVH  NORMAL RIGHT VENTRICULAR SYSTOLIC FUNCTION  MILD VALVULAR REGURGITATION (See above)  MODERATE VALVULAR STENOSIS (See above)  LA MILDLY ENLARGED  AVA(VTI)= 1.10cm^2  MODERATE AS  MMILD TR  EF >55%     Neuro/Psych negative neurological ROS  negative psych ROS   GI/Hepatic Neg liver ROS,GERD  ,,  Endo/Other  diabetes    Renal/GU negative Renal ROS  negative genitourinary   Musculoskeletal   Abdominal   Peds  Hematology negative hematology ROS (+)   Anesthesia Other Findings Past Medical History: No date: Aortic valve stenosis No date: Carotid artery stenosis No date: Coronary artery disease No date: Diabetes mellitus without complication (HCC) No date: GERD (gastroesophageal reflux disease) No date: Hypertension No date: Neuropathy     Comment:  feet No date: Wears hearing aid in both ears   Reproductive/Obstetrics negative OB ROS                              Anesthesia Physical Anesthesia Plan  ASA: 2  Anesthesia Plan: General   Post-op Pain Management:    Induction: Intravenous  PONV Risk Score and Plan: 2 and Propofol  infusion and TIVA  Airway  Management Planned: Natural Airway and Nasal Cannula  Additional Equipment:   Intra-op Plan:   Post-operative Plan:   Informed Consent: I have reviewed the patients History and Physical, chart, labs and discussed the procedure including the risks, benefits and alternatives for the proposed anesthesia with the patient or authorized representative who has indicated his/her understanding and acceptance.     Dental Advisory Given  Plan Discussed with: Anesthesiologist, CRNA and Surgeon  Anesthesia Plan Comments:          Anesthesia Quick Evaluation

## 2023-11-26 NOTE — Progress Notes (Signed)
 Upon patient admission assessment and EKG, noting what appears to be complete heart block, notified Dr Hilarie, bp stable. Son in room with update given. Hr remains in low 30's, sats on room air stable at this time. Patient states has been very tired and dizzy every day, no syncope noted per patient. Holding off on cardioversion with cardiology to come and evaluate patient for admission.

## 2023-11-27 ENCOUNTER — Encounter: Payer: Self-pay | Admitting: Cardiovascular Disease

## 2023-11-27 ENCOUNTER — Other Ambulatory Visit: Payer: Self-pay

## 2023-11-27 ENCOUNTER — Encounter
Admission: EM | Disposition: A | Discharge status: Home / Self Care | Payer: Self-pay | Source: Ambulatory Visit | Attending: Internal Medicine

## 2023-11-27 DIAGNOSIS — E785 Hyperlipidemia, unspecified: Secondary | ICD-10-CM

## 2023-11-27 DIAGNOSIS — I10 Essential (primary) hypertension: Secondary | ICD-10-CM | POA: Diagnosis not present

## 2023-11-27 DIAGNOSIS — I4892 Unspecified atrial flutter: Secondary | ICD-10-CM | POA: Diagnosis not present

## 2023-11-27 DIAGNOSIS — E119 Type 2 diabetes mellitus without complications: Secondary | ICD-10-CM

## 2023-11-27 DIAGNOSIS — I2510 Atherosclerotic heart disease of native coronary artery without angina pectoris: Secondary | ICD-10-CM

## 2023-11-27 DIAGNOSIS — I350 Nonrheumatic aortic (valve) stenosis: Secondary | ICD-10-CM | POA: Diagnosis not present

## 2023-11-27 DIAGNOSIS — I442 Atrioventricular block, complete: Secondary | ICD-10-CM | POA: Diagnosis not present

## 2023-11-27 HISTORY — PX: PACEMAKER LEADLESS INSERTION: EP1219

## 2023-11-27 LAB — PROTIME-INR
INR: 1.4 — ABNORMAL HIGH (ref 0.8–1.2)
Prothrombin Time: 17.9 s — ABNORMAL HIGH (ref 11.4–15.2)

## 2023-11-27 LAB — COMPREHENSIVE METABOLIC PANEL WITH GFR
ALT: 9 U/L (ref 0–44)
AST: 14 U/L — ABNORMAL LOW (ref 15–41)
Albumin: 3.7 g/dL (ref 3.5–5.0)
Alkaline Phosphatase: 56 U/L (ref 38–126)
Anion gap: 8 (ref 5–15)
BUN: 25 mg/dL — ABNORMAL HIGH (ref 8–23)
CO2: 26 mmol/L (ref 22–32)
Calcium: 9.4 mg/dL (ref 8.9–10.3)
Chloride: 107 mmol/L (ref 98–111)
Creatinine, Ser: 0.99 mg/dL (ref 0.61–1.24)
GFR, Estimated: >60 mL/min (ref >60)
Glucose, Bld: 150 mg/dL — ABNORMAL HIGH (ref 70–99)
Potassium: 4.3 mmol/L (ref 3.5–5.1)
Sodium: 141 mmol/L (ref 135–145)
Total Bilirubin: 0.6 mg/dL (ref 0.0–1.2)
Total Protein: 5.7 g/dL — ABNORMAL LOW (ref 6.5–8.1)

## 2023-11-27 LAB — GLUCOSE, CAPILLARY
Glucose-Capillary: 154 mg/dL — ABNORMAL HIGH (ref 70–99)
Glucose-Capillary: 108 mg/dL — ABNORMAL HIGH (ref 70–99)
Glucose-Capillary: 120 mg/dL — ABNORMAL HIGH (ref 70–99)
Glucose-Capillary: 123 mg/dL — ABNORMAL HIGH (ref 70–99)
Glucose-Capillary: 257 mg/dL — ABNORMAL HIGH (ref 70–99)

## 2023-11-27 LAB — CBC
HCT: 31.4 % — ABNORMAL LOW (ref 39.0–52.0)
Hemoglobin: 10.9 g/dL — ABNORMAL LOW (ref 13.0–17.0)
MCH: 29.4 pg (ref 26.0–34.0)
MCHC: 34.7 g/dL (ref 30.0–36.0)
MCV: 84.6 fL (ref 80.0–100.0)
Platelets: 119 K/uL — ABNORMAL LOW (ref 150–400)
RBC: 3.71 MIL/uL — ABNORMAL LOW (ref 4.22–5.81)
RDW: 13.8 % (ref 11.5–15.5)
WBC: 3.2 K/uL — ABNORMAL LOW (ref 4.0–10.5)
nRBC: 0 % (ref 0.0–0.2)

## 2023-11-27 LAB — APTT: aPTT: 71 s — ABNORMAL HIGH (ref 24–36)

## 2023-11-27 SURGERY — PACEMAKER LEADLESS INSERTION
Anesthesia: Moderate Sedation

## 2023-11-27 MED ORDER — HEPARIN (PORCINE) IN NACL 1000-0.9 UT/500ML-% IV SOLN
INTRAVENOUS | Status: DC | PRN
  Administered 2023-11-27 (×2): 500 mL

## 2023-11-27 MED ORDER — FENTANYL CITRATE (PF) 100 MCG/2ML IJ SOLN
INTRAMUSCULAR | Status: AC
Start: 2023-11-27 — End: 2023-11-27
  Filled 2023-11-27: qty 2

## 2023-11-27 MED ORDER — METOPROLOL SUCCINATE ER 25 MG PO TB24
25.0000 mg | ORAL_TABLET | Freq: Every day | ORAL | Status: DC
  Administered 2023-11-27 – 2023-11-28 (×2): 25 mg via ORAL
  Filled 2023-11-27 (×2): qty 1

## 2023-11-27 MED ORDER — METOPROLOL TARTRATE 5 MG/5ML IV SOLN
INTRAVENOUS | Status: AC
  Filled 2023-11-27: qty 5

## 2023-11-27 MED ORDER — LIDOCAINE HCL 1 % IJ SOLN
INTRAMUSCULAR | Status: AC
  Filled 2023-11-27: qty 20

## 2023-11-27 MED ORDER — HEPARIN SODIUM (PORCINE) 1000 UNIT/ML IJ SOLN
INTRAMUSCULAR | Status: AC
Start: 2023-11-27 — End: 2023-11-27
  Filled 2023-11-27: qty 10

## 2023-11-27 MED ORDER — LIDOCAINE HCL (PF) 1 % IJ SOLN
INTRAMUSCULAR | Status: DC | PRN
  Administered 2023-11-27: 10 mL

## 2023-11-27 MED ORDER — HEPARIN (PORCINE) IN NACL 1000-0.9 UT/500ML-% IV SOLN
INTRAVENOUS | Status: AC
  Filled 2023-11-27: qty 1000

## 2023-11-27 MED ORDER — SODIUM CHLORIDE 0.9% FLUSH: 3.0000 mL | INTRAVENOUS | Status: DC | PRN

## 2023-11-27 MED ORDER — SODIUM CHLORIDE 0.9 % IV SOLN
80.0000 mg | INTRAVENOUS | Status: DC
  Filled 2023-11-27: qty 2

## 2023-11-27 MED ORDER — ACETAMINOPHEN 325 MG PO TABS
650.0000 mg | ORAL_TABLET | ORAL | Status: DC | PRN
  Administered 2023-11-27: 650 mg via ORAL
  Filled 2023-11-27: qty 2

## 2023-11-27 MED ORDER — SODIUM CHLORIDE 0.9 % IV SOLN: 250.0000 mL | INTRAVENOUS | Status: DC | PRN

## 2023-11-27 MED ORDER — CEFAZOLIN SODIUM-DEXTROSE 2-4 GM/100ML-% IV SOLN
INTRAVENOUS | Status: AC
  Filled 2023-11-27: qty 100

## 2023-11-27 MED ORDER — SODIUM CHLORIDE 0.9% FLUSH
3.0000 mL | Freq: Two times a day (BID) | INTRAVENOUS | Status: DC
  Administered 2023-11-27 – 2023-11-28 (×3): 3 mL via INTRAVENOUS

## 2023-11-27 MED ORDER — CEFAZOLIN SODIUM-DEXTROSE 2-4 GM/100ML-% IV SOLN
2.0000 g | INTRAVENOUS | Status: DC
  Filled 2023-11-27: qty 100

## 2023-11-27 MED ORDER — MIDAZOLAM HCL 2 MG/2ML IJ SOLN
INTRAMUSCULAR | Status: AC
  Filled 2023-11-27: qty 2

## 2023-11-27 MED ORDER — METOPROLOL TARTRATE 5 MG/5ML IV SOLN
INTRAVENOUS | Status: DC | PRN
  Administered 2023-11-27: 5 mg via INTRAVENOUS

## 2023-11-27 MED ORDER — HEPARIN SODIUM (PORCINE) 1000 UNIT/ML IJ SOLN
INTRAMUSCULAR | Status: DC | PRN
  Administered 2023-11-27: 4500 [IU] via INTRAVENOUS
  Administered 2023-11-27: 5000 [IU] via INTRAVENOUS

## 2023-11-27 MED ORDER — IOHEXOL 300 MG/ML  SOLN
INTRAMUSCULAR | Status: DC | PRN
  Administered 2023-11-27: 15 mL

## 2023-11-27 SURGICAL SUPPLY — 13 items
DILATOR VESSEL 38 20CM 12FR (INTRODUCER) IMPLANT
DILATOR VESSEL 38 20CM 14FR (INTRODUCER) IMPLANT
DILATOR VESSEL 38 20CM 18FR (INTRODUCER) IMPLANT
DILATOR VESSEL 38 20CM 8FR (INTRODUCER) IMPLANT
NDL PERC 18GX7CM (NEEDLE) IMPLANT
NEEDLE PERC 18GX7CM (NEEDLE) ×1 IMPLANT
PACEMAKER LEADLESS AV2 MICRA (Pacemaker) IMPLANT
PACK CARDIAC CATH (CUSTOM PROCEDURE TRAY) ×1 IMPLANT
PAD ELECT DEFIB RADIOL ZOLL (MISCELLANEOUS) IMPLANT
SHEATH AVANTI 7FRX11 (SHEATH) IMPLANT
SHEATH INTRODUCER MICRA (SHEATH) IMPLANT
SUT SILK 0 FSL (SUTURE) IMPLANT
WIRE AMPLATZ SS-J .035X180CM (WIRE) IMPLANT

## 2023-11-27 NOTE — Progress Notes (Signed)
 [facility] CLINIC CARDIOLOGY PROGRESS NOTE       Patient ID: Eduardo Ashley MRN: [id number] DOB/AGE: 12/06/1930 89 y.o.  Admit date: 11/26/2023 Referring Physician Dr. Roann Primary Physician Jonette Lauraine BRAVO, PA-C Primary Cardiologist Dr. Jessee Dr. Dewane Reason for Consultation complete heart block  HPI: Eduardo Ashley is a 89 y.o. male  with a past medical history of severe aortic stenosis, history of second-degree AV block Mobitz type I, paroxsymal atrial fibrillation/flutter s/p DCCV and ablation, coronary artery disease s/p stent to LAD (01/2011), hypertension, hyperlipidemia, type 2 diabetes, carotid artery disease who presented for outpatient DCCV for atrial flutter on 11/26/2023.  Patient's preoperative EKG and telemetry revealed complete heart block with rates in the 30s.  Patient states he has had lightheadedness for the past year that has worsened more recently.  Patient is without chest pain or SOB.  Of note patient is in the process of being worked up for TAVR for severe aortic stenosis with Dr. Katina.  Cardiology was consulted for further evaluation.   Interval history: -patient seen and examined this AM, resting comfortably in hospital bed.  -Reports feeling well overall. Denies any dizziness/lightheadedness/syncope.  -BP and HR stable, intermittent CHB on tele with rates in the 40s.  Review of systems complete and found to be negative unless listed above    Past Medical History:  Diagnosis Date   Aortic valve stenosis    Carotid artery stenosis    Coronary artery disease    Diabetes mellitus without complication (HCC)    GERD (gastroesophageal reflux disease)    Hypertension    Neuropathy    feet   Wears hearing aid in both ears     Past Surgical History:  Procedure Laterality Date   LEFT HEART CATH AND CORONARY ANGIOGRAPHY Left 06/25/2017   Procedure: LEFT HEART CATH AND CORONARY ANGIOGRAPHY;  Surgeon: Hester Wolm PARAS, MD;  Location: [facility] INVASIVE CV LAB;   Service: Cardiovascular;  Laterality: Left;    Medications Prior to Admission  Medication Sig Dispense Refill Last Dose/Taking   acetaminophen  (TYLENOL ) 500 MG tablet Take 1,000 mg by mouth every 6 (six) hours as needed for moderate pain or headache.   11/25/2023   apixaban  (ELIQUIS ) 5 MG TABS tablet Take 5 mg by mouth 2 (two) times daily.   11/26/2023   Ascorbic Acid (VITAMIN C PO) Take 1,000 mg by mouth daily. 500 mg   Past Week   aspirin  EC 81 MG tablet Take 81 mg by mouth daily.   11/25/2023   cholecalciferol (VITAMIN D3) 25 MCG (1000 UNIT) tablet Take 1,000 Units by mouth daily.   11/25/2023   finasteride (PROSCAR) 5 MG tablet Take 5 mg by mouth daily.   11/25/2023   gabapentin (NEURONTIN) 600 MG tablet Take 600-1,200 mg by mouth See admin instructions. Take 600 mg by mouth in the morning and take 1200 mg by mouth at bedtime   11/25/2023   glimepiride (AMARYL) 2 MG tablet Take 2-4 mg by mouth See admin instructions. Take 4 mg at breakfast and 2 mg at dinner   11/25/2023   irbesartan  (AVAPRO ) 300 MG tablet Take 300 mg by mouth daily.   11/25/2023   magnesium  oxide (MAG-OX) 400 MG tablet Take 400 mg by mouth daily.   11/25/2023   metFORMIN (GLUCOPHAGE) 1000 MG tablet Take 1,000 mg by mouth 2 (two) times daily with a meal.   11/25/2023   pravastatin  (PRAVACHOL ) 40 MG tablet Take 40 mg by mouth at bedtime.   11/25/2023  sitaGLIPtin (JANUVIA) 100 MG tablet Take 100 mg by mouth daily.   11/25/2023   tamsulosin  (FLOMAX ) 0.4 MG CAPS capsule Take 0.8 mg by mouth at bedtime.   11/25/2023   vitamin B-12 (CYANOCOBALAMIN) 500 MCG tablet Take 500 mcg by mouth daily.   11/25/2023   metoprolol  succinate (TOPROL -XL) 25 MG 24 hr tablet Take 25 mg by mouth daily. (Patient not taking: Reported on 11/24/2023)   Not Taking   Social History   Socioeconomic History   Marital status: Widowed    Spouse name: Not on file   Number of children: Not on file   Years of education: Not on file   Highest education level: Not on  file  Occupational History   Not on file  Tobacco Use   Smoking status: Former    Current packs/day: 0.00    Types: Cigarettes    Quit date: 65    Years since quitting: 56.6   Smokeless tobacco: Never  Vaping Use   Vaping status: Never Used  Substance and Sexual Activity   Alcohol use: No   Drug use: Not on file   Sexual activity: Not on file  Other Topics Concern   Not on file  Social History Narrative   Not on file   Social Drivers of Health   Financial Resource Strain: Low Risk  (02/13/2023)   [facility] from [facility]   Overall Financial Resource Strain (CARDIA)    Difficulty of Paying Living Expenses: Not very hard  Food Insecurity: No Food Insecurity (02/13/2023)   [facility]   Hunger Vital Sign    Within the past 12 months, you worried that your food would run out before you got the money to buy more.: Never true    Within the past 12 months, the food you bought just didn't last and you didn't have money to get more.: Never true  Transportation Needs: Unknown (11/26/2023)   PRAPARE - Administrator, Civil Service (Medical): Patient declined    Lack of Transportation (Non-Medical): Not on file  Physical Activity: Not on file  Stress: Not on file  Social Connections: Not on file  Intimate Partner Violence: Not on file    History reviewed. No pertinent family history.   Vitals:   11/26/23 2339 11/27/23 0400 11/27/23 0743 11/27/23 0849  BP: (!) 158/51 (!) 148/61 (!) 181/73 (!) 168/62  Pulse: (!) 59  64   Resp: 15 11 18 18   Temp: 98.3 F (36.8 C) 98 F (36.7 C) 97.8 F (36.6 C)   TempSrc: Oral Oral    SpO2: 97% 98% 98%   Weight:      Height:        PHYSICAL EXAM General: Well-appearing elderly male, well nourished, in no acute distress. HEENT: Normocephalic and atraumatic. Neck: No JVD.   Lungs: Normal respiratory effort on room air. Clear bilaterally to auscultation. No wheezes, crackles,  rhonchi.  Heart: HRR, slow rate. Normal S1 and S2, + systolic murmur.  Abdomen: Non-distended appearing.  Msk: Normal strength and tone for age. Extremities: Warm and well perfused. No clubbing, cyanosis, edema.  Neuro: Alert and oriented X 3. Psych: Answers questions appropriately.   Labs: Basic Metabolic Panel: Recent Labs    11/26/23 1417 11/27/23 0145  NA 143 141  K 4.4 4.3  CL 103 107  CO2 29 26  GLUCOSE 202* 150*  BUN 22 25*  CREATININE 1.02 0.99  CALCIUM 9.6 9.4   Liver  Function Tests: Recent Labs    11/27/23 0145  AST 14*  ALT 9  ALKPHOS 56  BILITOT 0.6  PROT 5.7*  ALBUMIN 3.7   No results for input(s): LIPASE, AMYLASE in the last 72 hours. CBC: Recent Labs    11/26/23 1416 11/27/23 0145  WBC 3.5* 3.2*  HGB 10.9* 10.9*  HCT 31.9* 31.4*  MCV 86.0 84.6  PLT 120* 119*   Cardiac Enzymes: No results for input(s): CKTOTAL, CKMB, CKMBINDEX, TROPONINIHS in the last 72 hours. BNP: No results for input(s): BNP in the last 72 hours. D-Dimer: No results for input(s): DDIMER in the last 72 hours. Hemoglobin A1C: Recent Labs    11/26/23 1417  HGBA1C 7.4*   Fasting Lipid Panel: No results for input(s): CHOL, HDL, LDLCALC, TRIG, CHOLHDL, LDLDIRECT in the last 72 hours. Thyroid  Function Tests: Recent Labs    11/26/23 1417  TSH 1.638   Anemia Panel: No results for input(s): VITAMINB12, FOLATE, FERRITIN, TIBC, IRON, RETICCTPCT in the last 72 hours.   Radiology: No results found.  ECHO 09/10/2023 NORMAL LEFT VENTRICULAR SYSTOLIC FUNCTION WITH MODERATE LVH  ESTIMATED EF: >55%  NORMAL LA PRESSURES WITH DIASTOLIC DYSFUNCTION (GRADE 1)  NORMAL RIGHT VENTRICULAR SYSTOLIC FUNCTION  VALVULAR REGURGITATION: No AR, MODERATE MR, No PR, MILD TR  ESTIMATED RVSP: 44 mmHg  VALVULAR STENOSIS: SEVERE AS, No MS, No PS, No TS   TELEMETRY reviewed by me 11/27/2023: complete heart block, rate 40s   EKG reviewed by me: Complete  heart block, 31 bpm   Data reviewed by me 11/27/2023: last 24h vitals tele labs imaging I/O hospitalist H &P.  Principal Problem:   Complete heart block (HCC) Active Problems:   Atrial flutter (HCC)   DM (diabetes mellitus) (HCC)   HTN (hypertension)   HLD (hyperlipidemia)    ASSESSMENT AND PLAN:  Eduardo Ashley is a 89 y.o. male  with a past medical history of severe aortic stenosis, history of second-degree AV block Mobitz type I, paroxsymal atrial fibrillation/flutter s/p DCCV and ablation, coronary artery disease s/p stent to LAD (01/2011), hypertension, hyperlipidemia, type 2 diabetes, carotid artery disease who presented for outpatient DCCV for atrial flutter on 11/26/2023.  Patient's preoperative EKG and telemetry revealed complete heart block with rates in the 30s.  Patient states he has had lightheadedness for the past year that has worsened more recently.  Patient is without chest pain or SOB.  Of note patient is in the process of being worked up for TAVR for severe aortic stenosis with Dr. Katina.  Cardiology was consulted for further evaluation.   # Complete heart block # Paroxsymal atrial fibrillation/flutter  EKG and telemetry with complete heart block rate 30s.  Prior EKG from 08/2023 with preserved EF. -Avoid all AV nodal blockers. -Place external pacing pads. -Plan for leadless Micra PPM on 08/15 [place]. -Discussed the risks and benefits of proceeding with PPM for complete heart block He is agreeable to proceed.  NPO at midnight until procedure(08/14 at 10:45 AM) with Dr. Ammon.  Written consent will be obtained.  -Continue IV heparin  drip for stroke risk reduction, will discontinue 2 hours prior to procedure (heparin  ordered to be discontinued at 0830 on 08/15). Plan to resume home PO Eliquis  after procedure if groin incision site and Hgb stable.   # Severe Aortic Stenosis Patient is being evaluated by Dr. Katina regarding TAVR. CT TAVR imaging ordered/pending.   -Follow-up outpatient with Dr. Katina  # Coronary artery disease s/p stent to LAD (2012) # Hypertension #  Hyperlipidemia -Continue home aspirin  81 mg, pravastatin  40 mg daily. -Continue home irbesartan  300 mg daily. -Will avoid beta-blocker at this time due to complete heart block.  This patient's plan of care was discussed and created with Dr. Ammon and he is in agreement.  Signed: Danita Bloch, PA-C  11/27/2023, 9:19 AM [facility]

## 2023-11-27 NOTE — Assessment & Plan Note (Signed)
 S/p stent to LAD in 2012. - Continue with home aspirin  and pravastatin 

## 2023-11-27 NOTE — Assessment & Plan Note (Signed)
 Patient takes Januvia, metformin and glipizide at home. - SSI while in the hospital

## 2023-11-27 NOTE — Assessment & Plan Note (Signed)
-   Continue home statin

## 2023-11-27 NOTE — Hospital Course (Addendum)
 Taken from prior notes.  Eduardo Ashley is a pleasant 89 y.o. male with medical history significant for atrial fibrillation electively brought in for cardioversion, aortic stenosis being worked up for possible TAVR, DM, HTN, GERD who was brought into outpatient procedure area for elective cardioversion.  Upon admission to procedure area, patient's EKG was noted to have complete heart block.  Cardiology evaluated the patient and they requested for hospitalist evaluation for admission for possible pacemaker.  Patient was feeling tired and dizzy for the last month no chest pain.  8/15: Blood pressure elevated at 181/73, heart rate in 60s, labs seem stable, INR of 1.4 and A1c of 7.4.  TSH normal. S/p pacemaker placement today, cardiology would like to observe for another night.  8/16: Remained hemodynamically stable and symptom-free.  No more dizziness.  Pacemaker seems working fine and cardiology cleared him for discharge.  They will arrange a close outpatient follow-up.  Cardiology restarted low-dose metoprolol .  Patient will continue on current medications and follow-up with his providers closely for further assistance.

## 2023-11-27 NOTE — Assessment & Plan Note (Signed)
-   Continue with home irbesartan  - Avoid nodal blocking agents due to complete heart block

## 2023-11-27 NOTE — Assessment & Plan Note (Signed)
 Patient was having dizziness and weakness for some time, no presyncope or syncope.  Presented for DCCV and found to have complete heart block. S/p leadless Micra pacemaker placement today. - Follow-up cardiology recommendations

## 2023-11-27 NOTE — Plan of Care (Signed)
   Problem: Education: Goal: Knowledge of General Education information will improve Description: Including pain rating scale, medication(s)/side effects and non-pharmacologic comfort measures Outcome: Progressing   Problem: Clinical Measurements: Goal: Ability to maintain clinical measurements within normal limits will improve Outcome: Progressing Goal: Cardiovascular complication will be avoided Outcome: Progressing   Problem: Activity: Goal: Risk for activity intolerance will decrease Outcome: Progressing   Problem: Coping: Goal: Level of anxiety will decrease Outcome: Progressing   Problem: Elimination: Goal: Will not experience complications related to urinary retention Outcome: Progressing   Problem: Safety: Goal: Ability to remain free from injury will improve Outcome: Progressing

## 2023-11-27 NOTE — Consult Note (Signed)
 PHARMACY - ANTICOAGULATION CONSULT NOTE  Pharmacy Consult for :Heparin  infusion Indication: atrial fibrillation  Allergies  Allergen Reactions   Hydrochlorothiazide     Muscle cramps   Lisinopril Cough   Lovastatin     Muscle pain    Patient Measurements: Height: 6' (182.9 cm) Weight: 83.9 kg (185 lb) IBW/kg (Calculated) : 77.6 HEPARIN  DW (KG): 83.9  Vital Signs: Temp: 98.3 F (36.8 C) (08/14 2339) Temp Source: Oral (08/14 2339) BP: 158/51 (08/14 2339) Pulse Rate: 59 (08/14 2339)  Labs: Recent Labs    11/26/23 1416 11/26/23 1417 11/27/23 0145  HGB 10.9*  --  10.9*  HCT 31.9*  --  31.4*  PLT 120*  --  119*  APTT 41*  --  71*  HEPARINUNFRC >1.10*  --   --   CREATININE  --  1.02 0.99    Estimated Creatinine Clearance: 54.4 mL/min (by C-G formula based on SCr of 0.99 mg/dL).   Medical History: Past Medical History:  Diagnosis Date   Aortic valve stenosis    Carotid artery stenosis    Coronary artery disease    Diabetes mellitus without complication (HCC)    GERD (gastroesophageal reflux disease)    Hypertension    Neuropathy    feet   Wears hearing aid in both ears     Medications:  Patient on Apixaban  5mg  po BID PTA, last dose 8/14 in AM  Assessment: 89yo male with PMH including Afib, aortic stenosis, DM, HTN, GERD. Admitted with complete heart block, Pharmacy consulted to start and manage heparin  infusion.   Plan pacemaker placement tomorrow 8/15. Baseline labs appropriate to start heparin  infusion with HL >1.10 as expected with apixaban  usage.  Goal of Therapy:  Heparin  level 0.3-0.7 units/ml aPTT 66-102 seconds Monitor platelets by anticoagulation protocol: Yes   Plan:  8/15 @ 0145:  aPTT = 71, therapeutic X 1 - will continue pt on current rate and recheck aPTT and HL in 8 hrs Will use aPTT until correlating with HL Continue to monitor H&H and platelets  - Heparin  gtt to d/c on 8/14 @ 0830 for procedure   Kiandre Spagnolo D 11/27/2023 2:40  AM

## 2023-11-27 NOTE — Assessment & Plan Note (Signed)
 Patient was occasionally plan for outpatient cardioversion and found to have now complete heart block. - Home Eliquis  was held and switched with heparin  infusion. - Will resume home Eliquis  after the procedure and if there incision site was without any significant bleeding

## 2023-11-27 NOTE — Assessment & Plan Note (Addendum)
 Patient is being evaluated by Dr. Katina regarding TAVR. - Follow-up outpatient for further evaluation and management

## 2023-11-27 NOTE — Progress Notes (Signed)
  Progress Note   Patient: Eduardo Ashley FMW:[id number] DOB: 09-Oct-1930 DOA: 11/26/2023     1 DOS: the patient was seen and examined on 11/27/2023   Brief hospital course: Taken from prior notes.  Eduardo Ashley is a pleasant 89 y.o. male with medical history significant for atrial fibrillation electively brought in for cardioversion, aortic stenosis being worked up for possible TAVR, DM, HTN, GERD who was brought into outpatient procedure area for elective cardioversion.  Upon admission to procedure area, patient's EKG was noted to have complete heart block.  Cardiology evaluated the patient and they requested for hospitalist evaluation for admission for possible pacemaker.  Patient was feeling tired and dizzy for the last month no chest pain.  8/15: Blood pressure elevated at 181/73, heart rate in 60s, labs seem stable, INR of 1.4 and A1c of 7.4.  TSH normal. S/p pacemaker placement today, cardiology would like to observe for another night.  Assessment and Plan: * Complete heart block (HCC) Patient was having dizziness and weakness for some time, no presyncope or syncope.  Presented for DCCV and found to have complete heart block. S/p leadless Micra pacemaker placement today. - Follow-up cardiology recommendations  Atrial flutter [facility]) Patient was occasionally plan for outpatient cardioversion and found to have now complete heart block. - Home Eliquis  was held and switched with heparin  infusion. - Will resume home Eliquis  after the procedure and if there incision site was without any significant bleeding  Aortic valve stenosis Patient is being evaluated by Dr. Katina regarding TAVR. - Follow-up outpatient for further evaluation and management  Coronary artery disease S/p stent to LAD in 2012. - Continue with home aspirin  and pravastatin   HTN (hypertension) - Continue with home irbesartan  - Avoid nodal blocking agents due to complete heart block  HLD (hyperlipidemia) - Continue  home statin  DM (diabetes mellitus) (HCC) Patient takes Januvia, metformin and glipizide at home. - SSI while in the hospital   Subjective: Patient was seen and examined today.  Nuys any chest pain or shortness of breath.  No dizziness while lying down.  He was awaiting for his procedure.  Physical Exam: Vitals:   11/27/23 1241 11/27/23 1246 11/27/23 1300 11/27/23 1307  BP: (!) 188/57 (!) 180/79 (!) 175/65 (!) 155/58  Pulse: 70 (!) 55 (!) 58   Resp: (!) 21 17    Temp:      TempSrc:      SpO2: 100% 100% 94%   Weight:      Height:       General.  Frail elderly man, in no acute distress. Pulmonary.  Lungs clear bilaterally, normal respiratory effort. CV.  Borderline bradycardia Abdomen.  Soft, nontender, nondistended, BS positive. CNS.  Alert and oriented .  No focal neurologic deficit. Extremities.  No edema, no cyanosis, pulses intact and symmetrical. Psychiatry.  Judgment and insight appears normal.   Data Reviewed: Prior data reviewed  Family Communication: Discussed with DIL at bedside  Disposition: Status is: Inpatient Remains inpatient appropriate because: Severity of illness  Planned Discharge Destination: Home  DVT prophylaxis.  Heparin  infusion Time spent: 50 minutes  This record has been created using Conservation officer, historic buildings. Errors have been sought and corrected,but may not always be located. Such creation errors do not reflect on the standard of care.   Author: Amaryllis Dare, MD 11/27/2023 1:56 PM  For on call review [email or web address].

## 2023-11-28 DIAGNOSIS — I10 Essential (primary) hypertension: Secondary | ICD-10-CM | POA: Diagnosis not present

## 2023-11-28 DIAGNOSIS — I442 Atrioventricular block, complete: Secondary | ICD-10-CM | POA: Diagnosis not present

## 2023-11-28 DIAGNOSIS — I4892 Unspecified atrial flutter: Secondary | ICD-10-CM | POA: Diagnosis not present

## 2023-11-28 DIAGNOSIS — I350 Nonrheumatic aortic (valve) stenosis: Secondary | ICD-10-CM | POA: Diagnosis not present

## 2023-11-28 LAB — BASIC METABOLIC PANEL WITH GFR
Anion gap: 9 (ref 5–15)
BUN: 21 mg/dL (ref 8–23)
CO2: 24 mmol/L (ref 22–32)
Calcium: 9 mg/dL (ref 8.9–10.3)
Chloride: 106 mmol/L (ref 98–111)
Creatinine, Ser: 0.95 mg/dL (ref 0.61–1.24)
GFR, Estimated: >60 mL/min (ref >60)
Glucose, Bld: 134 mg/dL — ABNORMAL HIGH (ref 70–99)
Potassium: 3.9 mmol/L (ref 3.5–5.1)
Sodium: 139 mmol/L (ref 135–145)

## 2023-11-28 LAB — CBC
HCT: 32.7 % — ABNORMAL LOW (ref 39.0–52.0)
Hemoglobin: 11 g/dL — ABNORMAL LOW (ref 13.0–17.0)
MCH: 28.5 pg (ref 26.0–34.0)
MCHC: 33.6 g/dL (ref 30.0–36.0)
MCV: 84.7 fL (ref 80.0–100.0)
Platelets: 115 K/uL — ABNORMAL LOW (ref 150–400)
RBC: 3.86 MIL/uL — ABNORMAL LOW (ref 4.22–5.81)
RDW: 13.8 % (ref 11.5–15.5)
WBC: 4.3 K/uL (ref 4.0–10.5)
nRBC: 0 % (ref 0.0–0.2)

## 2023-11-28 LAB — GLUCOSE, CAPILLARY: Glucose-Capillary: 185 mg/dL — ABNORMAL HIGH (ref 70–99)

## 2023-11-28 MED ORDER — PRAVASTATIN SODIUM 40 MG PO TABS: 40.0000 mg | ORAL_TABLET | Freq: Every day | ORAL | Status: DC

## 2023-11-28 MED ORDER — APIXABAN 5 MG PO TABS
5.0000 mg | ORAL_TABLET | Freq: Two times a day (BID) | ORAL | Status: DC
  Administered 2023-11-28: 5 mg via ORAL
  Filled 2023-11-28: qty 1

## 2023-11-28 MED ORDER — IRBESARTAN 150 MG PO TABS
300.0000 mg | ORAL_TABLET | Freq: Every day | ORAL | Status: DC
Start: 2023-11-28 — End: 2023-11-28
  Administered 2023-11-28: 300 mg via ORAL
  Filled 2023-11-28: qty 2

## 2023-11-28 MED ORDER — ASPIRIN 81 MG PO TBEC
81.0000 mg | DELAYED_RELEASE_TABLET | Freq: Every day | ORAL | Status: DC
  Administered 2023-11-28: 81 mg via ORAL
  Filled 2023-11-28: qty 1

## 2023-11-28 NOTE — Discharge Summary (Signed)
 Physician Discharge Summary   Patient: Eduardo Ashley MRN: [id number] DOB: 02-Apr-1931  Admit date:     11/26/2023  Discharge date: 11/28/23  Discharge Physician: Amaryllis Dare   PCP: Jonette Lauraine BRAVO, PA-C   Recommendations at discharge:  Please obtain CBC and BMP on follow-up Follow-up with cardiology Follow-up with primary care provider  Discharge Diagnoses: Principal Problem:   Complete heart block [facility]) Active Problems:   Atrial flutter (HCC)   Aortic valve stenosis   HTN (hypertension)   Coronary artery disease   HLD (hyperlipidemia)   DM (diabetes mellitus) [facility])   Hospital Course: Taken from prior notes.  Eduardo Ashley is a pleasant 89 y.o. male with medical history significant for atrial fibrillation electively brought in for cardioversion, aortic stenosis being worked up for possible TAVR, DM, HTN, GERD who was brought into outpatient procedure area for elective cardioversion.  Upon admission to procedure area, patient's EKG was noted to have complete heart block.  Cardiology evaluated the patient and they requested for hospitalist evaluation for admission for possible pacemaker.  Patient was feeling tired and dizzy for the last month no chest pain.  8/15: Blood pressure elevated at 181/73, heart rate in 60s, labs seem stable, INR of 1.4 and A1c of 7.4.  TSH normal. S/p pacemaker placement today, cardiology would like to observe for another night.  8/16: Remained hemodynamically stable and symptom-free.  No more dizziness.  Pacemaker seems working fine and cardiology cleared him for discharge.  They will arrange a close outpatient follow-up.  Cardiology restarted low-dose metoprolol .  Patient will continue on current medications and follow-up with his providers closely for further assistance.  Assessment and Plan: * Complete heart block (HCC) Patient was having dizziness and weakness for some time, no presyncope or syncope.  Presented for DCCV and found to have  complete heart block. S/p leadless Micra pacemaker placement on 11/27/2023   Atrial flutter [facility]) Patient was occasionally plan for outpatient cardioversion and found to have now complete heart block. Continue home Eliquis . Cardiology restarted low-dose metoprolol   Aortic valve stenosis Patient is being evaluated by Dr. Katina regarding TAVR. - Follow-up outpatient for further evaluation and management  Coronary artery disease S/p stent to LAD in 2012. - Continue with home aspirin  and pravastatin   HTN (hypertension) - Continue with home irbesartan   HLD (hyperlipidemia) - Continue home statin  DM (diabetes mellitus) (HCC) Patient takes Januvia, metformin and glipizide at home. - SSI while in the hospital  Consultants: Cardiology Procedures performed: Pacemaker placement Disposition: Home Diet recommendation:  Discharge Diet Orders (From admission, onward)     Start     Ordered   11/28/23 0000  Diet - low sodium heart healthy        11/28/23 1032           Cardiac and Carb modified diet DISCHARGE MEDICATION: Allergies as of 11/28/2023       Reactions   Hydrochlorothiazide    Muscle cramps   Lisinopril Cough   Lovastatin    Muscle pain        Medication List     TAKE these medications    acetaminophen  500 MG tablet Commonly known as: TYLENOL  Take 1,000 mg by mouth every 6 (six) hours as needed for moderate pain or headache.   aspirin  EC 81 MG tablet Take 81 mg by mouth daily.   cholecalciferol 25 MCG (1000 UNIT) tablet Commonly known as: VITAMIN D3 Take 1,000 Units by mouth daily.   Eliquis  5 MG Tabs  tablet Generic drug: apixaban  Take 5 mg by mouth 2 (two) times daily.   finasteride 5 MG tablet Commonly known as: PROSCAR Take 5 mg by mouth daily.   gabapentin 600 MG tablet Commonly known as: NEURONTIN Take 600-1,200 mg by mouth See admin instructions. Take 600 mg by mouth in the morning and take 1200 mg by mouth at bedtime   glimepiride 2  MG tablet Commonly known as: AMARYL Take 2-4 mg by mouth See admin instructions. Take 4 mg at breakfast and 2 mg at dinner   irbesartan  300 MG tablet Commonly known as: AVAPRO  Take 300 mg by mouth daily.   magnesium  oxide 400 MG tablet Commonly known as: MAG-OX Take 400 mg by mouth daily.   metFORMIN 1000 MG tablet Commonly known as: GLUCOPHAGE Take 1,000 mg by mouth 2 (two) times daily with a meal.   metoprolol  succinate 25 MG 24 hr tablet Commonly known as: TOPROL -XL Take 25 mg by mouth daily.   pravastatin  40 MG tablet Commonly known as: PRAVACHOL  Take 40 mg by mouth at bedtime.   sitaGLIPtin 100 MG tablet Commonly known as: JANUVIA Take 100 mg by mouth daily.   tamsulosin  0.4 MG Caps capsule Commonly known as: FLOMAX  Take 0.8 mg by mouth at bedtime.   vitamin B-12 500 MCG tablet Commonly known as: CYANOCOBALAMIN Take 500 mcg by mouth daily.   VITAMIN C PO Take 1,000 mg by mouth daily. 500 mg        Follow-up Information     Custovic, Sabina, DO. Go in 2 week(s).   Specialty: Cardiology Why: Appointment scheduled with Dr. Dewane on 8/27 at 8:30 AM Contact information: [address] [place] [address] [phone]         Jonette Lauraine BRAVO, PA-C. Schedule an appointment as soon as possible for a visit in 1 week(s).   Specialty: Physician Assistant Contact information: [address] [place] [address] [phone]                Discharge Exam: Fredricka Weights   11/26/23 1114 11/27/23 1028  Weight: 83.9 kg 83.9 kg   General.  Frail elderly man, in no acute distress. Pulmonary.  Lungs clear bilaterally, normal respiratory effort. CV.  Regular rate and rhythm, no JVD, rub or murmur. Abdomen.  Soft, nontender, nondistended, BS positive. CNS.  Alert and oriented .  No focal neurologic deficit. Extremities.  No edema, no cyanosis, pulses intact and symmetrical. Psychiatry.  Judgment and insight appears normal.    Condition at discharge: stable  The results of significant diagnostics from this hospitalization (including imaging, microbiology, ancillary and laboratory) are listed below for reference.   Imaging Studies: EP PPM/ICD IMPLANT Result Date: 11/27/2023 Successful Medtronic Micra AV 2 leadless pacemaker implantation    Microbiology: No results found for this or any previous visit.  Labs: CBC: Recent Labs  Lab 11/26/23 1416 11/27/23 0145 11/28/23 0512  WBC 3.5* 3.2* 4.3  HGB 10.9* 10.9* 11.0*  HCT 31.9* 31.4* 32.7*  MCV 86.0 84.6 84.7  PLT 120* 119* 115*   Basic Metabolic Panel: Recent Labs  Lab 11/26/23 1417 11/27/23 0145 11/28/23 0512  NA 143 141 139  K 4.4 4.3 3.9  CL 103 107 106  CO2 29 26 24   GLUCOSE 202* 150* 134*  BUN 22 25* 21  CREATININE 1.02 0.99 0.95  CALCIUM 9.6 9.4 9.0   Liver Function Tests: Recent Labs  Lab 11/27/23 0145  AST 14*  ALT 9  ALKPHOS 56  BILITOT 0.6  PROT 5.7*  ALBUMIN 3.7   CBG: Recent Labs  Lab 11/27/23 1037 11/27/23 1307 11/27/23 1657 11/27/23 2039 11/28/23 0745  GLUCAP 108* 120* 123* 257* 185*    Discharge time spent: greater than 30 minutes.  This record has been created using Conservation officer, historic buildings. Errors have been sought and corrected,but may not always be located. Such creation errors do not reflect on the standard of care.   Signed: Amaryllis Dare, MD [facility] 11/28/2023

## 2023-11-28 NOTE — Plan of Care (Signed)
  Problem: Education: Goal: Knowledge of General Education information will improve Description: Including pain rating scale, medication(s)/side effects and non-pharmacologic comfort measures Outcome: Progressing   Problem: Health Behavior/Discharge Planning: Goal: Ability to manage health-related needs will improve Outcome: Progressing   Problem: Clinical Measurements: Goal: Ability to maintain clinical measurements within normal limits will improve Outcome: Progressing Goal: Will remain free from infection Outcome: Progressing Goal: Diagnostic test results will improve Outcome: Progressing Goal: Cardiovascular complication will be avoided Outcome: Progressing   Problem: Activity: Goal: Risk for activity intolerance will decrease Outcome: Progressing   Problem: Coping: Goal: Level of anxiety will decrease Outcome: Progressing   Problem: Elimination: Goal: Will not experience complications related to urinary retention Outcome: Progressing   Problem: Safety: Goal: Ability to remain free from injury will improve Outcome: Progressing

## 2023-11-28 NOTE — Progress Notes (Signed)
 Pt alert and oriented. Discharge instructions reviewed with pt and family. Pt and family verbalized understanding. Pt and family verbalized understanding of need to schedule/attend follow up appointments. Pt and family verbalized understanding of s/s of infection and when to notify MD. IV removed. IV site WNL. Pacemaker information given to pt. Printed discharge instructions given to pt. Pt left unit in wheelchair with nurse. Pt left hospital with family.

## 2023-11-28 NOTE — Progress Notes (Cosign Needed Addendum)
 [facility] CLINIC CARDIOLOGY PROGRESS NOTE       Patient ID: Eduardo Ashley MRN: [id number] DOB/AGE: 1930/07/29 89 y.o.  Admit date: 11/26/2023 Referring Physician Dr. Roann Primary Physician Jonette Lauraine BRAVO, PA-C Primary Cardiologist Dr. Jessee Dr. Dewane Reason for Consultation complete heart block  HPI: Eduardo Ashley is a 89 y.o. male  with a past medical history of severe aortic stenosis, history of second-degree AV block Mobitz type I, paroxsymal atrial fibrillation/flutter s/p DCCV and ablation, coronary artery disease s/p stent to LAD (01/2011), hypertension, hyperlipidemia, type 2 diabetes, carotid artery disease who presented for outpatient DCCV for atrial flutter on 11/26/2023.  Patient's preoperative EKG and telemetry revealed complete heart block with rates in the 30s.  Patient states he has had lightheadedness for the past year that has worsened more recently.  Patient is without chest pain or SOB.  Of note patient is in the process of being worked up for TAVR for severe aortic stenosis with Dr. Katina.  Cardiology was consulted for further evaluation.   Interval history: -patient seen and examined this AM, resting comfortably in hospital bed.  -Reports feeling well overall. Denies any dizziness/lightheadedness/syncope.  -Tolerated Micra placement well yesterday, no significant tenderness at access site. Minimal bruising noted.  -BP and HR stable, pacer appears to be functioning well on tele.  Review of systems complete and found to be negative unless listed above    Past Medical History:  Diagnosis Date   Aortic valve stenosis    Carotid artery stenosis    Coronary artery disease    Diabetes mellitus without complication (HCC)    GERD (gastroesophageal reflux disease)    Hypertension    Neuropathy    feet   Wears hearing aid in both ears     Past Surgical History:  Procedure Laterality Date   CARDIOVERSION N/A 11/26/2023   Procedure: CARDIOVERSION;  Surgeon:  Hilarie Rocher, MD;  Location: [facility] ORS;  Service: Cardiovascular;  Laterality: N/A;   LEFT HEART CATH AND CORONARY ANGIOGRAPHY Left 06/25/2017   Procedure: LEFT HEART CATH AND CORONARY ANGIOGRAPHY;  Surgeon: Hester Wolm PARAS, MD;  Location: [facility] INVASIVE CV LAB;  Service: Cardiovascular;  Laterality: Left;    Medications Prior to Admission  Medication Sig Dispense Refill Last Dose/Taking   acetaminophen  (TYLENOL ) 500 MG tablet Take 1,000 mg by mouth every 6 (six) hours as needed for moderate pain or headache.   11/25/2023   apixaban  (ELIQUIS ) 5 MG TABS tablet Take 5 mg by mouth 2 (two) times daily.   11/26/2023   Ascorbic Acid (VITAMIN C PO) Take 1,000 mg by mouth daily. 500 mg   Past Week   aspirin  EC 81 MG tablet Take 81 mg by mouth daily.   11/25/2023   cholecalciferol (VITAMIN D3) 25 MCG (1000 UNIT) tablet Take 1,000 Units by mouth daily.   11/25/2023   finasteride (PROSCAR) 5 MG tablet Take 5 mg by mouth daily.   11/25/2023   gabapentin (NEURONTIN) 600 MG tablet Take 600-1,200 mg by mouth See admin instructions. Take 600 mg by mouth in the morning and take 1200 mg by mouth at bedtime   11/25/2023   glimepiride (AMARYL) 2 MG tablet Take 2-4 mg by mouth See admin instructions. Take 4 mg at breakfast and 2 mg at dinner   11/25/2023   irbesartan  (AVAPRO ) 300 MG tablet Take 300 mg by mouth daily.   11/25/2023   magnesium  oxide (MAG-OX) 400 MG tablet Take 400 mg by mouth daily.   11/25/2023  metFORMIN (GLUCOPHAGE) 1000 MG tablet Take 1,000 mg by mouth 2 (two) times daily with a meal.   11/25/2023   pravastatin  (PRAVACHOL ) 40 MG tablet Take 40 mg by mouth at bedtime.   11/25/2023   sitaGLIPtin (JANUVIA) 100 MG tablet Take 100 mg by mouth daily.   11/25/2023   tamsulosin  (FLOMAX ) 0.4 MG CAPS capsule Take 0.8 mg by mouth at bedtime.   11/25/2023   vitamin B-12 (CYANOCOBALAMIN) 500 MCG tablet Take 500 mcg by mouth daily.   11/25/2023   metoprolol  succinate (TOPROL -XL) 25 MG 24 hr tablet Take 25 mg by mouth  daily. (Patient not taking: Reported on 11/24/2023)   Not Taking   Social History   Socioeconomic History   Marital status: Widowed    Spouse name: Not on file   Number of children: Not on file   Years of education: Not on file   Highest education level: Not on file  Occupational History   Not on file  Tobacco Use   Smoking status: Former    Current packs/day: 0.00    Types: Cigarettes    Quit date: 70    Years since quitting: 56.6   Smokeless tobacco: Never  Vaping Use   Vaping status: Never Used  Substance and Sexual Activity   Alcohol use: No   Drug use: Not on file   Sexual activity: Not on file  Other Topics Concern   Not on file  Social History Narrative   Not on file   Social Drivers of Health   Financial Resource Strain: Low Risk  (02/13/2023)   Received from [facility]   Overall Financial Resource Strain (CARDIA)    Difficulty of Paying Living Expenses: Not very hard  Food Insecurity: No Food Insecurity (02/13/2023)   [facility]   Hunger Vital Sign    Within the past 12 months, you worried that your food would run out before you got the money to buy more.: Never true    Within the past 12 months, the food you bought just didn't last and you didn't have money to get more.: Never true  Transportation Needs: Unknown (11/26/2023)   PRAPARE - Administrator, Civil Service (Medical): Patient declined    Lack of Transportation (Non-Medical): Not on file  Physical Activity: Not on file  Stress: Not on file  Social Connections: Not on file  Intimate Partner Violence: Not on file    History reviewed. No pertinent family history.   Vitals:   11/27/23 2008 11/27/23 2308 11/27/23 2325 11/28/23 0314  BP: (!) 150/80 (!) 181/105 (!) 160/80 134/66  Pulse:  61  60  Resp:  16  20  Temp:  97.9 F (36.6 C)  98.4 F (36.9 C)  TempSrc:      SpO2:  97%  96%  Weight:      Height:        PHYSICAL  EXAM General: Well-appearing elderly male, well nourished, in no acute distress. HEENT: Normocephalic and atraumatic. Neck: No JVD.   Lungs: Normal respiratory effort on room air. Clear bilaterally to auscultation. No wheezes, crackles, rhonchi.  Heart: HRRR. Normal S1 and S2, + systolic murmur.  Abdomen: Non-distended appearing.  Msk: Normal strength and tone for age. Extremities: Warm and well perfused. No clubbing, cyanosis, edema. R groin access site with minimal ecchymosis, no significant tenderness or swelling. Neuro: Alert and oriented X 3. Psych: Answers questions appropriately.   Labs: Basic Metabolic Panel: Recent  Labs    11/27/23 0145 11/28/23 0512  NA 141 139  K 4.3 3.9  CL 107 106  CO2 26 24  GLUCOSE 150* 134*  BUN 25* 21  CREATININE 0.99 0.95  CALCIUM 9.4 9.0   Liver Function Tests: Recent Labs    11/27/23 0145  AST 14*  ALT 9  ALKPHOS 56  BILITOT 0.6  PROT 5.7*  ALBUMIN 3.7   No results for input(s): LIPASE, AMYLASE in the last 72 hours. CBC: Recent Labs    11/27/23 0145 11/28/23 0512  WBC 3.2* 4.3  HGB 10.9* 11.0*  HCT 31.4* 32.7*  MCV 84.6 84.7  PLT 119* 115*   Cardiac Enzymes: No results for input(s): CKTOTAL, CKMB, CKMBINDEX, TROPONINIHS in the last 72 hours. BNP: No results for input(s): BNP in the last 72 hours. D-Dimer: No results for input(s): DDIMER in the last 72 hours. Hemoglobin A1C: Recent Labs    11/26/23 1417  HGBA1C 7.4*   Fasting Lipid Panel: No results for input(s): CHOL, HDL, LDLCALC, TRIG, CHOLHDL, LDLDIRECT in the last 72 hours. Thyroid  Function Tests: Recent Labs    11/26/23 1417  TSH 1.638   Anemia Panel: No results for input(s): VITAMINB12, FOLATE, FERRITIN, TIBC, IRON, RETICCTPCT in the last 72 hours.   Radiology: EP PPM/ICD IMPLANT Result Date: 11/27/2023 Successful Medtronic Micra AV 2 leadless pacemaker implantation    ECHO 09/10/2023 NORMAL LEFT VENTRICULAR  SYSTOLIC FUNCTION WITH MODERATE LVH  ESTIMATED EF: >55%  NORMAL LA PRESSURES WITH DIASTOLIC DYSFUNCTION (GRADE 1)  NORMAL RIGHT VENTRICULAR SYSTOLIC FUNCTION  VALVULAR REGURGITATION: No AR, MODERATE MR, No PR, MILD TR  ESTIMATED RVSP: 44 mmHg  VALVULAR STENOSIS: SEVERE AS, No MS, No PS, No TS   TELEMETRY reviewed by me 11/28/2023: V pacing rate 60s   EKG reviewed by me: Complete heart block, 31 bpm   Data reviewed by me 11/28/2023: last 24h vitals tele labs imaging I/O hospitalist H &P.   Principal Problem:   Complete heart block (HCC) Active Problems:   Atrial flutter (HCC)   DM (diabetes mellitus) (HCC)   HTN (hypertension)   HLD (hyperlipidemia)   Aortic valve stenosis   Coronary artery disease    ASSESSMENT AND PLAN:  LAVERNE KLUGH is a 89 y.o. male  with a past medical history of severe aortic stenosis, history of second-degree AV block Mobitz type I, paroxsymal atrial fibrillation/flutter s/p DCCV and ablation, coronary artery disease s/p stent to LAD (01/2011), hypertension, hyperlipidemia, type 2 diabetes, carotid artery disease who presented for outpatient DCCV for atrial flutter on 11/26/2023.  Patient's preoperative EKG and telemetry revealed complete heart block with rates in the 30s.  Patient states he has had lightheadedness for the past year that has worsened more recently.  Patient is without chest pain or SOB.  Of note patient is in the process of being worked up for TAVR for severe aortic stenosis with Dr. Katina.  Cardiology was consulted for further evaluation.   # Complete heart block # Paroxsymal atrial fibrillation/flutter  EKG and telemetry with complete heart block rate 30s.  Prior EKG from 08/2023 with preserved EF. S/p Micra AV 2 Leadless PPM on 11/27/2023 without complications.  -Plan for leadless Micra PPM on 08/15 [place]. -R groin figure of 8 suture removed. No active bleeding. Minimal bruising. No tenderness, mass, swelling. Site cleaned and redressed.   -Resume home PO Eliquis  5 mg bid this AM. -Continue metoprolol  succinate 25 mg daily.   # Severe Aortic Stenosis Patient is being evaluated  by Dr. Katina regarding TAVR. CT TAVR imaging ordered/pending.  -Follow-up outpatient with Dr. Katina  # Coronary artery disease s/p stent to LAD (2012) # Hypertension # Hyperlipidemia -Continue home aspirin  81 mg, pravastatin  40 mg daily. -Continue home irbesartan  300 mg daily.  Ok for discharge today from a cardiac perspective. Will arrange for follow up in clinic with Dr. Custovic in 1-2 weeks.    This patient's plan of care was discussed and created with Dr. Florencio and he is in agreement.  Signed: Danita Bloch, PA-C  11/28/2023, 8:52 AM [facility]

## 2023-11-30 ENCOUNTER — Other Ambulatory Visit: Payer: Self-pay

## 2023-11-30 ENCOUNTER — Emergency Department

## 2023-11-30 ENCOUNTER — Encounter: Payer: Self-pay | Admitting: Cardiology

## 2023-11-30 ENCOUNTER — Emergency Department: Admission: EM | Admit: 2023-11-30 | Discharge: 2023-11-30 | Disposition: A | Discharge status: Home / Self Care

## 2023-11-30 DIAGNOSIS — S0003XA Contusion of scalp, initial encounter: Secondary | ICD-10-CM | POA: Diagnosis not present

## 2023-11-30 DIAGNOSIS — R0602 Shortness of breath: Secondary | ICD-10-CM | POA: Insufficient documentation

## 2023-11-30 DIAGNOSIS — N289 Disorder of kidney and ureter, unspecified: Secondary | ICD-10-CM | POA: Diagnosis not present

## 2023-11-30 DIAGNOSIS — E119 Type 2 diabetes mellitus without complications: Secondary | ICD-10-CM | POA: Diagnosis not present

## 2023-11-30 DIAGNOSIS — R55 Syncope and collapse: Secondary | ICD-10-CM | POA: Diagnosis not present

## 2023-11-30 DIAGNOSIS — R531 Weakness: Secondary | ICD-10-CM | POA: Diagnosis not present

## 2023-11-30 DIAGNOSIS — I10 Essential (primary) hypertension: Secondary | ICD-10-CM | POA: Diagnosis not present

## 2023-11-30 DIAGNOSIS — J9811 Atelectasis: Secondary | ICD-10-CM | POA: Diagnosis not present

## 2023-11-30 DIAGNOSIS — I2510 Atherosclerotic heart disease of native coronary artery without angina pectoris: Secondary | ICD-10-CM | POA: Insufficient documentation

## 2023-11-30 DIAGNOSIS — I6523 Occlusion and stenosis of bilateral carotid arteries: Secondary | ICD-10-CM | POA: Diagnosis not present

## 2023-11-30 DIAGNOSIS — I480 Paroxysmal atrial fibrillation: Secondary | ICD-10-CM | POA: Diagnosis not present

## 2023-11-30 DIAGNOSIS — I4439 Other atrioventricular block: Secondary | ICD-10-CM | POA: Diagnosis not present

## 2023-11-30 DIAGNOSIS — Z950 Presence of cardiac pacemaker: Secondary | ICD-10-CM | POA: Diagnosis not present

## 2023-11-30 DIAGNOSIS — G8918 Other acute postprocedural pain: Secondary | ICD-10-CM

## 2023-11-30 DIAGNOSIS — R42 Dizziness and giddiness: Secondary | ICD-10-CM

## 2023-11-30 DIAGNOSIS — E785 Hyperlipidemia, unspecified: Secondary | ICD-10-CM | POA: Diagnosis not present

## 2023-11-30 DIAGNOSIS — I350 Nonrheumatic aortic (valve) stenosis: Secondary | ICD-10-CM | POA: Diagnosis not present

## 2023-11-30 DIAGNOSIS — W1839XA Other fall on same level, initial encounter: Secondary | ICD-10-CM | POA: Insufficient documentation

## 2023-11-30 DIAGNOSIS — W19XXXA Unspecified fall, initial encounter: Secondary | ICD-10-CM

## 2023-11-30 LAB — CBC
HCT: 28.4 % — ABNORMAL LOW (ref 39.0–52.0)
Hemoglobin: 9.8 g/dL — ABNORMAL LOW (ref 13.0–17.0)
MCH: 29.2 pg (ref 26.0–34.0)
MCHC: 34.5 g/dL (ref 30.0–36.0)
MCV: 84.5 fL (ref 80.0–100.0)
Platelets: 138 K/uL — ABNORMAL LOW (ref 150–400)
RBC: 3.36 MIL/uL — ABNORMAL LOW (ref 4.22–5.81)
RDW: 14.2 % (ref 11.5–15.5)
WBC: 8.8 K/uL (ref 4.0–10.5)
nRBC: 0 % (ref 0.0–0.2)

## 2023-11-30 LAB — COMPREHENSIVE METABOLIC PANEL WITH GFR
ALT: 11 U/L (ref 0–44)
AST: 23 U/L (ref 15–41)
Albumin: 3.4 g/dL — ABNORMAL LOW (ref 3.5–5.0)
Alkaline Phosphatase: 49 U/L (ref 38–126)
Anion gap: 13 (ref 5–15)
BUN: 37 mg/dL — ABNORMAL HIGH (ref 8–23)
CO2: 23 mmol/L (ref 22–32)
Calcium: 8.9 mg/dL (ref 8.9–10.3)
Chloride: 100 mmol/L (ref 98–111)
Creatinine, Ser: 2.15 mg/dL — ABNORMAL HIGH (ref 0.61–1.24)
GFR, Estimated: 29 mL/min — ABNORMAL LOW (ref >60)
Glucose, Bld: 223 mg/dL — ABNORMAL HIGH (ref 70–99)
Potassium: 4.4 mmol/L (ref 3.5–5.1)
Sodium: 136 mmol/L (ref 135–145)
Total Bilirubin: 0.5 mg/dL (ref 0.0–1.2)
Total Protein: 5.6 g/dL — ABNORMAL LOW (ref 6.5–8.1)

## 2023-11-30 LAB — TROPONIN I (HIGH SENSITIVITY)
Troponin I (High Sensitivity): 44 ng/L — ABNORMAL HIGH (ref <18)
Troponin I (High Sensitivity): 42 ng/L — ABNORMAL HIGH (ref <18)

## 2023-11-30 LAB — CREATININE, SERUM
Creatinine, Ser: 2.08 mg/dL — ABNORMAL HIGH (ref 0.61–1.24)
GFR, Estimated: 30 mL/min — ABNORMAL LOW (ref >60)

## 2023-11-30 LAB — MAGNESIUM: Magnesium: 1.8 mg/dL (ref 1.7–2.4)

## 2023-11-30 LAB — LIPASE, BLOOD: Lipase: 33 U/L (ref 11–51)

## 2023-11-30 LAB — BRAIN NATRIURETIC PEPTIDE: B Natriuretic Peptide: 64 pg/mL (ref 0.0–100.0)

## 2023-11-30 MED ORDER — SODIUM CHLORIDE 0.9 % IV BOLUS
500.0000 mL | Freq: Once | INTRAVENOUS | Status: AC
  Administered 2023-11-30: 500 mL via INTRAVENOUS

## 2023-11-30 MED ORDER — MAGNESIUM SULFATE 2 GM/50ML IV SOLN
2.0000 g | Freq: Once | INTRAVENOUS | Status: AC
  Administered 2023-11-30: 2 g via INTRAVENOUS
  Filled 2023-11-30: qty 50

## 2023-11-30 NOTE — ED Provider Notes (Signed)
 [facility] Provider Note    Event Date/Time   First MD Initiated Contact with Patient 11/30/23 1450     (approximate)   History   Near Syncope   HPI  Eduardo Ashley is a 89 y.o. male  past medical history of severe aortic stenosis, history of second-degree AV block Mobitz type I, paroxsymal atrial fibrillation/flutter s/p DCCV and ablation, coronary artery disease s/p stent to LAD (01/2011), hypertension, hyperlipidemia, type 2 diabetes, carotid artery disease who presented for outpatient DCCV for atrial flutter on 11/26/2023 recently admitted and had pacemaker placed on 11/27/23.  Patient presents several days after discharge with episodes of lightheadedness and shortness of breath with exertion.  He states that when he walks he feels unsteady on his feet and he did fall today and hit his head against a wooden chest.  He denies any symptoms when lying in the bed.  He denies ever actually losing consciousness.  He has no chest pain.  He did have a little bit of abdominal pain yesterday which she equates to his typical heartburn which has resolved.  He denies any heartburn currently.  He denies any weakness in his extremities or sensation changes.  No hearing or vision changes     Physical Exam   Triage Vital Signs: ED Triage Vitals  Encounter Vitals Group     BP 11/30/23 1445 (!) 133/54     Girls Systolic BP Percentile --      Girls Diastolic BP Percentile --      Boys Systolic BP Percentile --      Boys Diastolic BP Percentile --      Pulse Rate 11/30/23 1442 66     Resp 11/30/23 1442 18     Temp 11/30/23 1442 (!) 97.4 F (36.3 C)     Temp Source 11/30/23 1442 Oral     SpO2 11/30/23 1442 100 %     Weight --      Height --      Head Circumference --      Peak Flow --      Pain Score 11/30/23 1443 0     Pain Loc --      Pain Education --      Exclude from Growth Chart --     Most recent vital signs: Vitals:   11/30/23 1700 11/30/23 1800  BP:  109/67 125/79  Pulse: 61 63  Resp: (!) 21 20  Temp:    SpO2: 100% 100%    Nursing Triage Note reviewed. Vital signs reviewed and patients oxygen saturation is normoxic  General: Patient is well nourished, well developed, awake and alert, resting comfortably in no acute distress Head: Normocephalic over patient's right, temporal scalp is an abrasion and ecchymosis Eyes: Normal inspection, extraocular muscles intact, no conjunctival pallor Ear, nose, throat: Normal external exam Neck: Normal range of motion, no C-spine tenderness to palpation Respiratory: Patient is in no respiratory distress, lungs CTAB Cardiovascular: Patient is not tachycardic, RR Pacemaker pocket on left chest with mild ecchymosis but clean dry and intact GI: Abd SNT with no guarding or rebound  Back: Normal inspection of the back with good strength and range of motion throughout all ext Extremities: pulses intact with good cap refills, no LE pitting edema or calf tenderness Neuro: The patient is alert and oriented to person, place, and time, appropriately conversive, with 5/5 bilat UE/LE strength, no gross motor or sensory defects noted. Coordination appears to be adequate.  No dysmetria to  finger-to-nose Skin: Warm, dry, and intact Psych: normal mood and affect, no SI or HI  ED Results / Procedures / Treatments   Labs (all labs ordered are listed, but only abnormal results are displayed) Labs Reviewed  COMPREHENSIVE METABOLIC PANEL WITH GFR - Abnormal; Notable for the following components:      Result Value   Glucose, Bld 223 (*)    BUN 37 (*)    Creatinine, Ser 2.15 (*)    Total Protein 5.6 (*)    Albumin 3.4 (*)    GFR, Estimated 29 (*)    All other components within normal limits  CBC - Abnormal; Notable for the following components:   RBC 3.36 (*)    Hemoglobin 9.8 (*)    HCT 28.4 (*)    Platelets 138 (*)    All other components within normal limits  CREATININE, SERUM - Abnormal; Notable for the  following components:   Creatinine, Ser 2.08 (*)    GFR, Estimated 30 (*)    All other components within normal limits  TROPONIN I (HIGH SENSITIVITY) - Abnormal; Notable for the following components:   Troponin I (High Sensitivity) 44 (*)    All other components within normal limits  TROPONIN I (HIGH SENSITIVITY) - Abnormal; Notable for the following components:   Troponin I (High Sensitivity) 42 (*)    All other components within normal limits  BRAIN NATRIURETIC PEPTIDE  LIPASE, BLOOD  MAGNESIUM   BASIC METABOLIC PANEL WITH GFR  CBC     EKG EKG and rhythm strip are interpreted by myself:   EKG: paced rhythm] at heart rate of 66, wide QRS duration, QTc 473, nonspecific ST segments and T waves no ectopy EKG not consistent with Acute STEMI Rhythm strip: Paced rhythm in lead II LBB but not signficantly changed from 11/27/23  RADIOLOGY CT head without: No acute abnormality on my independent review and interpretation and radiologist agrees Xray chest: No acute abnormality on my independent review interpretation radiologist agrees CT C-spine: No acute abnormality     PROCEDURES:  Critical Care performed: No  Procedures   MEDICATIONS ORDERED IN ED: Medications  sodium chloride  0.9 % bolus 500 mL (0 mLs Intravenous Stopped 11/30/23 1728)  magnesium  sulfate IVPB 2 g 50 mL (0 g Intravenous Stopped 11/30/23 1850)     IMPRESSION / MDM / ASSESSMENT AND PLAN / ED COURSE                                Differential diagnosis includes, but is not limited to, arrhythmia, orthostasis, electrolyte derangement, UTI intracranial hemorrhage atypical ACS, pneumonia   ED course: Patient has no focal neurological deficits on arrival.  EKG demonstrates a paced rhythm with an LBBB consistent with EKG upon discharge.  He does have evidence of head trauma and is on a blood thinner so we will send for CT imaging.  I did consider pulmonary embolism however he has been compliant with his blood  thinner and he is satting 100% on room air and denies any shortness of breath while lying in the bed.  He is slightly more anemic than his baseline however not to the point where he requires transfusion.  Case briefly discussed with cardiology who will see the patient.  Disposition pending remainder of diagnostics and cardiology recommendations   Clinical Course as of 11/30/23 2330  Mon Nov 30, 2023  1502 Epic message sent to cardiology group. They will see him [  HD]  [id number]  Per cardiology: Patient states he feels lightheaded/dizzy whenever he stands up. Denies any chest pain, SOB, palpitations or syncope. Per tele remains ventricular paced, rate stable 60s. Device interrogated and it has great function. Patient states he's been feeling better after IVFs. Recommend obtaining orthostatic vitals and adequate hydration. Labs still pending. Will continue to monitor tele in the meantime. No further recs at this time. Thanks! [HD]  1608 DG Chest 2 View No acute abnormality [HD]  1612 Troponin I (High Sensitivity)(!): 44 Elevated, I dont see a prior to compare to [HD]  1625 B Natriuretic Peptide: 64.0 Not elevated [HD]  1634 Creatinine(!): 2.15 Creatinine doubled than what it previous was [HD]  1716 Troponin I (High Sensitivity)(!): 42 [HD]  1749 Magnesium : 1.8 Will replete as goal is to be greater than 2 [HD]  1749 Creatinine(!): 2.08 Creatinine improved [HD]  1751 Patient's family at bedside.  His magnesium  was on the low end of normal and we will replete this.  He and his family feel comfortable returning home.  He does have a cardiology appointment already scheduled for tomorrow.  We discussed slow transitions from sitting to standing and using his walker at home.  We discussed that he needs a repeat creatinine check in 3 to 5 days and to pursue aggressive oral hydration.  He will return with any acutely worsening symptoms or any other emergency.  All questions answered to patient and family's  satisfaction and he will be able to be discharged after his magnesium  [HD]    Clinical Course User Index [HD] Nicholaus Rolland BRAVO, MD   ---  Risk: 5 This patient has a high risk of morbidity due to further diagnostic testing or treatment. Rationale: This patient's evaluation and management involve a high risk of morbidity due to the potential severity of presenting symptoms, need for diagnostic testing, and/or initiation of treatment that may require close monitoring. The differential includes conditions with potential for significant deterioration or requiring escalation of care. Treatment decisions in the ED, including medication administration, procedural interventions, or disposition planning, reflect this level of risk. Additional Support: -- Drug therapy requiring intensive monitoring for toxicity [ ]  -- Decision regarding elective major surgery with idenitified patient or procedure risk factors [ ]  -- Decision regarding hospitalization or escalation of hospital-level care [ ]  -- Decision not to resuscitate or to de-escalate care because of poor prognosis [ ]  -- Parental controlled substances [ ]   COPA: 5 The patient has a severe exacerbation, progression, or side effect of treatment of the following illness/illnesses: []  OR  The patient has the following acute or chronic illness/injury that poses a possible threat to life or bodily function: [X] : The patient has a potentially serious acute condition or an acute exacerbation of a chronic illness requiring urgent evaluation and management in the Emergency Department. The clinical presentation necessitates immediate consideration of life-threatening or function-threatening diagnoses, even if they are ultimately ruled out.  Data(2/3 categories following were performed): [ ]  I reviewed or ordered at least three unique tests, external notes, and/or the history required an independent historian as one of the three requirements as following: cbc,  bmp, troponin AND  I independently interpreted the following test: xray chest OR  I discussed the management of the patient with the following external physician or qualified healthcare provider: cardiology  Suggested E/M Coding Level: 5, 99285, This has been selected based on the 2021-12-12 CPT guidelines for E/M codes in the Emergency Department based on 2/3 of the  CoPA, Data, and Risk.   FINAL CLINICAL IMPRESSION(S) / ED DIAGNOSES   Final diagnoses:  Post procedure discomfort  Postural dizziness with near syncope  Fall from standing, initial encounter  Acute renal insufficiency     Rx / DC Orders   ED Discharge Orders     None        Note:  This document was prepared using Dragon voice recognition software and may include unintentional dictation errors.   Nicholaus Rolland BRAVO, MD 11/30/23 2330

## 2023-11-30 NOTE — Consult Note (Signed)
 [facility] CLINIC CARDIOLOGY CONSULT NOTE       Patient ID: Eduardo Ashley MRN: [id number] DOB/AGE: [birth year] 89 y.o.  Admit date: 11/30/2023 Referring Physician Dr. Nicholaus Primary Physician Jonette Lauraine BRAVO, PA-C Primary Cardiologist Dr. Jessee Dr. Dewane Reason for Consultation near syncope, recent PPM on 08/15  HPI: Eduardo Ashley is a 89 y.o. male  with a past medical history of severe aortic stenosis, complete heart block s/p Micra leadless PPM (11/27/2023), paroxsymal atrial fibrillation/flutter s/p DCCV and ablation, coronary artery disease s/p stent to LAD (01/2011), hypertension, hyperlipidemia, type 2 diabetes, carotid artery disease  who presented to the ED on 11/30/2023 for near syncopal episodes with exertion.  ED revealed positive orthostatics with SBP 110 lying down and SBP 80 when standing.  Recently underwent Micra leadless PPM on 11/27/2023 with Dr. Ammon. EKG and tele reveals ventricular pacing, rate stable 60s.  Cardiology was consulted for further evaluation.   Work up in the ED notable for Hgb 9.8, plts 138. BMP pending. Troponins pending. CXR pending. EKG with ventricular pacing, rate 60s. Patient started on IVF.   At the time of my evaluation this afternoon, patient was resting comfortably in ED stretcher. We discussed patients sxs in further detail. Patient states yesterday morning when he got up he felt dizzy/lightheaded. Patient states whenever he stands he feels that way and denies any syncope/LOC. Patient states otherwise he feels good. Patient denies any N/V/D. Reports having abdominal pain yesterday that has resolved. Denies any chest pain, palpitations or SOB. Per tele ventricular pacing, rate 60s. Patient states he feels better s/p IVFs.     Review of systems complete and found to be negative unless listed above    Past Medical History:  Diagnosis Date   Aortic valve stenosis    Carotid artery stenosis    Coronary artery disease    Diabetes mellitus  without complication (HCC)    GERD (gastroesophageal reflux disease)    Hypertension    Neuropathy    feet   Wears hearing aid in both ears     Past Surgical History:  Procedure Laterality Date   CARDIOVERSION N/A 11/26/2023   Procedure: CARDIOVERSION;  Surgeon: Hilarie Rocher, MD;  Location: [facility] ORS;  Service: Cardiovascular;  Laterality: N/A;   LEFT HEART CATH AND CORONARY ANGIOGRAPHY Left 06/25/2017   Procedure: LEFT HEART CATH AND CORONARY ANGIOGRAPHY;  Surgeon: Hester Wolm PARAS, MD;  Location: [facility] INVASIVE CV LAB;  Service: Cardiovascular;  Laterality: Left;   PACEMAKER LEADLESS INSERTION N/A 11/27/2023   Procedure: PACEMAKER LEADLESS INSERTION;  Surgeon: Ammon Blunt, MD;  Location: [facility] INVASIVE CV LAB;  Service: Cardiovascular;  Laterality: N/A;    (Not in a hospital admission)  Social History   Socioeconomic History   Marital status: Widowed    Spouse name: Not on file   Number of children: Not on file   Years of education: Not on file   Highest education level: Not on file  Occupational History   Not on file  Tobacco Use   Smoking status: Former    Current packs/day: 0.00    Types: Cigarettes    Quit date: 86    Years since quitting: 56.6   Smokeless tobacco: Never  Vaping Use   Vaping status: Never Used  Substance and Sexual Activity   Alcohol use: No   Drug use: Not on file   Sexual activity: Not on file  Other Topics Concern   Not on file  Social History Narrative   Not on  file   Social Drivers of Health   Financial Resource Strain: Low Risk  (02/13/2023)   Received from [facility]   Overall Financial Resource Strain (CARDIA)    Difficulty of Paying Living Expenses: Not very hard  Food Insecurity: No Food Insecurity (02/13/2023)   Received from [facility]   Hunger Vital Sign    Within the past 12 months, you worried that your food would run out before you got the money to buy more.: Never true     Within the past 12 months, the food you bought just didn't last and you didn't have money to get more.: Never true  Transportation Needs: Unknown (11/26/2023)   PRAPARE - Administrator, Civil Service (Medical): Patient declined    Lack of Transportation (Non-Medical): Not on file  Physical Activity: Not on file  Stress: Not on file  Social Connections: Not on file  Intimate Partner Violence: Not on file    History reviewed. No pertinent family history.   Vitals:   11/30/23 1442 11/30/23 1445 11/30/23 1500  BP:  (!) 133/54 123/65  Pulse: 66  68  Resp: 18  18  Temp: (!) 97.4 F (36.3 C)    TempSrc: Oral    SpO2: 100%  100%    PHYSICAL EXAM General: Well appearing elderly male, well nourished, in no acute distress. HEENT: Normocephalic and atraumatic. Neck: No JVD.   Lungs: Normal respiratory effort on room air. Clear bilaterally to auscultation. No wheezes, crackles, rhonchi.  Heart: HRRR. Normal S1 and S2, + systolic murmur Abdomen: Non-distended appearing.  Msk: Normal strength and tone for age. Extremities: Warm and well perfused. No clubbing, cyanosis, edema.  Neuro: Alert and oriented X 3. Psych: Answers questions appropriately.   Labs: Basic Metabolic Panel: Recent Labs    11/28/23 0512  NA 139  K 3.9  CL 106  CO2 24  GLUCOSE 134*  BUN 21  CREATININE 0.95  CALCIUM 9.0   Liver Function Tests: No results for input(s): AST, ALT, ALKPHOS, BILITOT, PROT, ALBUMIN in the last 72 hours. No results for input(s): LIPASE, AMYLASE in the last 72 hours. CBC: Recent Labs    11/28/23 0512 11/30/23 1446  WBC 4.3 8.8  HGB 11.0* 9.8*  HCT 32.7* 28.4*  MCV 84.7 84.5  PLT 115* 138*   Cardiac Enzymes: No results for input(s): CKTOTAL, CKMB, CKMBINDEX, TROPONINIHS in the last 72 hours. BNP: No results for input(s): BNP in the last 72 hours. D-Dimer: No results for input(s): DDIMER in the last 72 hours. Hemoglobin A1C: No  results for input(s): HGBA1C in the last 72 hours. Fasting Lipid Panel: No results for input(s): CHOL, HDL, LDLCALC, TRIG, CHOLHDL, LDLDIRECT in the last 72 hours. Thyroid  Function Tests: No results for input(s): TSH, T4TOTAL, T3FREE, THYROIDAB in the last 72 hours.  Invalid input(s): FREET3 Anemia Panel: No results for input(s): VITAMINB12, FOLATE, FERRITIN, TIBC, IRON, RETICCTPCT in the last 72 hours.   Radiology: EP PPM/ICD IMPLANT Result Date: 11/27/2023 Successful Med[employer]onic Micra AV 2 leadless pacemaker implantation    ECHO 09/10/2023 NORMAL LEFT VENTRICULAR SYSTOLIC FUNCTION WITH MODERATE LVH  ESTIMATED EF: >55%  NORMAL LA PRESSURES WITH DIASTOLIC DYSFUNCTION (GRADE 1)  NORMAL RIGHT VENTRICULAR SYSTOLIC FUNCTION  VALVULAR REGURGITATION: No AR, MODERATE MR, No PR, MILD TR  ESTIMATED RVSP: 44 mmHg  VALVULAR STENOSIS: SEVERE AS, No MS, No PS, No TS   TELEMETRY reviewed by me 11/30/2023: ventricular paced, rate 60s  EKG reviewed by me:  Ventricular paced, rate 66 bpm  Data reviewed by me 11/30/2023: last 24h vitals tele labs imaging I/O ED provider note, admission H&P.  Active Problems:   * No active hospital problems. *    ASSESSMENT AND PLAN:  ELYJAH HAZAN is a 89 y.o. male  with a past medical history of severe aortic stenosis, complete heart block s/p Micra leadless PPM (11/27/2023), paroxsymal atrial fibrillation/flutter s/p DCCV and ablation, coronary artery disease s/p stent to LAD (01/2011), hypertension, hyperlipidemia, type 2 diabetes, carotid artery disease  who presented to the ED on 11/30/2023 for near syncopal episodes with exertion.  ED revealed positive orthostatics with SBP 110 lying down and SBP 80 when standing.  Recently underwent Micra leadless PPM on 11/27/2023 with Dr. Ammon. EKG and tele reveals ventricular pacing, rate stable 60s.  Cardiology was consulted for further evaluation.   # Presyncope Patient reports  feeling lightheaded/dizzy whenever he stands up, started yesterday. Reports feeling better s/p IVFs. -Monitor and replenish electrolytes for a goal K >4, Mag >2  -Recommend obtaining orthostatic vitals. -Recommend adequate hydration.    # Complete heart block s/p leadless PPM (11/27/2023) # Paroxsymal atrial fibrillation/flutter  EKG and telemetry with regular paced, rate 60. Micra device interrogated (08/18) and reveals great function. -Resume home PO Eliquis  5 mg bid this AM.   # Severe Aortic Stenosis Patient is being evaluated by Dr. Katina regarding TAVR. CT TAVR imaging ordered/pending.  -Follow-up outpatient with Dr. Katina   # Coronary artery disease s/p stent to LAD (2012) # Hypertension # Hyperlipidemia -Resume home aspirin  81 mg, pravastatin  40 mg daily. -Resume home irbesartan  300 mg daily.    Patient has follow-up appointment scheduled with Dr. Custovic on 08/27.   This patient's plan of care was discussed and created with Dr. Florencio and he is in agreement.  Signed: Dorene Comfort, PA-C  11/30/2023, 3:06 PM [facility]

## 2023-11-30 NOTE — Discharge Instructions (Addendum)
 You were seen in the emergency department after lightheadedness and fall.  You were seen by cardiology and your pacemaker is working as expected.  You were found to be very dehydrated with an elevated creatinine.  Please have your primary care physician or cardiologist rechecked this number in 3 to 5 days.  In the interim please continue your regular medications.  Ensure adequate hydration.  Ambulate with a walker and use slow transitions from sitting to standing.  Return with any acutely worsening symptoms or any other emergency.  Keep your cardiology appointment tomorrow.  Is very nice meeting you and I wish you the best of luck with everything. -- RETURN PRECAUTIONS & AFTERCARE: (ENGLISH) RETURN PRECAUTIONS: Return immediately to the emergency department or see/call your doctor if you feel worse, weak or have changes in speech or vision, are short of breath, have fever, vomiting, pain, bleeding or dark stool, trouble urinating or any new issues. Return here or see/call your doctor if not improving as expected for your suspected condition. FOLLOW-UP CARE: Call your doctor and/or any doctors we referred you to for more advice and to make an appointment. Do this today, tomorrow or after the weekend. Some doctors only take PPO insurance so if you have HMO insurance you may want to contact your HMO or your regular doctor for referral to a specialist within your plan. Either way tell the doctor's office that it was a referral from the emergency department so you get the soonest possible appointment.  YOUR TEST RESULTS: Take result reports of any blood or urine tests, imaging tests and EKG's to your doctor and any referral doctor. Have any abnormal tests repeated. Your doctor or a referral doctor can let you know when this should be done. Also make sure your doctor contacts this hospital to get any test results that are not currently available such as cultures or special tests for infection and final imaging  reports, which are often not available at the time you leave the ER but which may list additional important findings that are not documented on the preliminary report. BLOOD PRESSURE: If your blood pressure was greater than 120/80 have your blood pressure rechecked within 1 to 2 weeks. MEDICATION SIDE EFFECTS: Do not drive, walk, bike, take the bus, etc. if you have received or are being prescribed any sedating medications such as those for pain or anxiety or certain antihistamines like Benadryl. If you have been give one of these here get a taxi home or have a friend drive you home. Ask your pharmacist to counsel you on potential side effects of any new medication

## 2023-11-30 NOTE — ED Triage Notes (Signed)
 EMS reports patient had a pacemaker placed on Friday and was discharged Saturday; today patient having near syncopal episodes. Ortho positive with EMS. SBP 110 lying and 80 when standing.

## 2023-12-01 DIAGNOSIS — I350 Nonrheumatic aortic (valve) stenosis: Secondary | ICD-10-CM | POA: Diagnosis not present

## 2023-12-01 DIAGNOSIS — I4892 Unspecified atrial flutter: Secondary | ICD-10-CM | POA: Diagnosis not present

## 2023-12-01 DIAGNOSIS — N179 Acute kidney failure, unspecified: Secondary | ICD-10-CM | POA: Diagnosis not present

## 2023-12-01 DIAGNOSIS — I2510 Atherosclerotic heart disease of native coronary artery without angina pectoris: Secondary | ICD-10-CM | POA: Diagnosis not present

## 2023-12-01 DIAGNOSIS — I442 Atrioventricular block, complete: Secondary | ICD-10-CM | POA: Diagnosis not present

## 2023-12-01 DIAGNOSIS — Z950 Presence of cardiac pacemaker: Secondary | ICD-10-CM | POA: Diagnosis not present

## 2023-12-02 DIAGNOSIS — I152 Hypertension secondary to endocrine disorders: Secondary | ICD-10-CM | POA: Diagnosis not present

## 2023-12-02 DIAGNOSIS — E1159 Type 2 diabetes mellitus with other circulatory complications: Secondary | ICD-10-CM | POA: Diagnosis not present

## 2023-12-02 DIAGNOSIS — Z09 Encounter for follow-up examination after completed treatment for conditions other than malignant neoplasm: Secondary | ICD-10-CM | POA: Diagnosis not present

## 2023-12-02 DIAGNOSIS — E1142 Type 2 diabetes mellitus with diabetic polyneuropathy: Secondary | ICD-10-CM | POA: Diagnosis not present

## 2023-12-02 DIAGNOSIS — I442 Atrioventricular block, complete: Secondary | ICD-10-CM | POA: Diagnosis not present

## 2023-12-02 DIAGNOSIS — Z950 Presence of cardiac pacemaker: Secondary | ICD-10-CM | POA: Diagnosis not present

## 2023-12-02 DIAGNOSIS — R42 Dizziness and giddiness: Secondary | ICD-10-CM | POA: Diagnosis not present

## 2023-12-02 DIAGNOSIS — I959 Hypotension, unspecified: Secondary | ICD-10-CM | POA: Diagnosis not present

## 2023-12-08 DIAGNOSIS — U071 COVID-19: Secondary | ICD-10-CM | POA: Diagnosis not present

## 2023-12-08 DIAGNOSIS — I350 Nonrheumatic aortic (valve) stenosis: Secondary | ICD-10-CM | POA: Diagnosis not present

## 2023-12-08 DIAGNOSIS — J40 Bronchitis, not specified as acute or chronic: Secondary | ICD-10-CM | POA: Diagnosis not present

## 2023-12-15 ENCOUNTER — Other Ambulatory Visit: Payer: Self-pay

## 2023-12-15 DIAGNOSIS — Z950 Presence of cardiac pacemaker: Secondary | ICD-10-CM | POA: Diagnosis not present

## 2023-12-15 DIAGNOSIS — I10 Essential (primary) hypertension: Secondary | ICD-10-CM | POA: Diagnosis not present

## 2023-12-15 DIAGNOSIS — I350 Nonrheumatic aortic (valve) stenosis: Secondary | ICD-10-CM | POA: Diagnosis not present

## 2023-12-15 DIAGNOSIS — I25119 Atherosclerotic heart disease of native coronary artery with unspecified angina pectoris: Secondary | ICD-10-CM | POA: Diagnosis not present

## 2023-12-15 DIAGNOSIS — I4892 Unspecified atrial flutter: Secondary | ICD-10-CM | POA: Diagnosis not present

## 2023-12-15 DIAGNOSIS — I442 Atrioventricular block, complete: Secondary | ICD-10-CM | POA: Diagnosis not present

## 2023-12-16 ENCOUNTER — Other Ambulatory Visit: Payer: Self-pay

## 2023-12-16 ENCOUNTER — Ambulatory Visit: Admission: RE | Admit: 2023-12-16 | Discharge: 2023-12-16 | Disposition: A | Discharge status: Home / Self Care

## 2023-12-16 ENCOUNTER — Encounter: Admission: RE | Disposition: A | Discharge status: Home / Self Care | Payer: Self-pay | Source: Home / Self Care

## 2023-12-16 DIAGNOSIS — R079 Chest pain, unspecified: Secondary | ICD-10-CM | POA: Insufficient documentation

## 2023-12-16 DIAGNOSIS — I2510 Atherosclerotic heart disease of native coronary artery without angina pectoris: Secondary | ICD-10-CM | POA: Diagnosis not present

## 2023-12-16 HISTORY — PX: LEFT HEART CATH AND CORONARY ANGIOGRAPHY: CATH118249

## 2023-12-16 LAB — GLUCOSE, CAPILLARY: Glucose-Capillary: 273 mg/dL — ABNORMAL HIGH (ref 70–99)

## 2023-12-16 SURGERY — LEFT HEART CATH AND CORONARY ANGIOGRAPHY
Anesthesia: Moderate Sedation | Laterality: Left

## 2023-12-16 MED ORDER — HEPARIN SODIUM (PORCINE) 1000 UNIT/ML IJ SOLN
INTRAMUSCULAR | Status: AC
  Filled 2023-12-16: qty 10

## 2023-12-16 MED ORDER — LIDOCAINE HCL (PF) 1 % IJ SOLN
INTRAMUSCULAR | Status: DC | PRN
Start: 2023-12-16 — End: 2023-12-16
  Administered 2023-12-16: 2 mL

## 2023-12-16 MED ORDER — LIDOCAINE HCL 1 % IJ SOLN
INTRAMUSCULAR | Status: AC
  Filled 2023-12-16: qty 20

## 2023-12-16 MED ORDER — LABETALOL HCL 5 MG/ML IV SOLN: 10.0000 mg | INTRAVENOUS | Status: DC | PRN

## 2023-12-16 MED ORDER — ACETAMINOPHEN 325 MG PO TABS: 650.0000 mg | ORAL_TABLET | ORAL | Status: DC | PRN

## 2023-12-16 MED ORDER — SODIUM CHLORIDE 0.9 % WEIGHT BASED INFUSION: 1.0000 mL/kg/h | INTRAVENOUS | Status: DC

## 2023-12-16 MED ORDER — SODIUM CHLORIDE 0.9% FLUSH: 3.0000 mL | INTRAVENOUS | Status: DC | PRN

## 2023-12-16 MED ORDER — FENTANYL CITRATE (PF) 100 MCG/2ML IJ SOLN
INTRAMUSCULAR | Status: DC | PRN
  Administered 2023-12-16: 25 ug via INTRAVENOUS

## 2023-12-16 MED ORDER — SODIUM CHLORIDE 0.9 % IV SOLN: 250.0000 mL | INTRAVENOUS | Status: DC | PRN

## 2023-12-16 MED ORDER — VERAPAMIL HCL 2.5 MG/ML IV SOLN
INTRAVENOUS | Status: AC
  Filled 2023-12-16: qty 2

## 2023-12-16 MED ORDER — VERAPAMIL HCL 2.5 MG/ML IV SOLN
INTRAVENOUS | Status: DC | PRN
  Administered 2023-12-16: 2.5 mg via INTRAVENOUS

## 2023-12-16 MED ORDER — HEPARIN SODIUM (PORCINE) 1000 UNIT/ML IJ SOLN
INTRAMUSCULAR | Status: DC | PRN
  Administered 2023-12-16: 5000 [IU] via INTRAVENOUS

## 2023-12-16 MED ORDER — ASPIRIN 81 MG PO CHEW
81.0000 mg | CHEWABLE_TABLET | ORAL | Status: AC
  Administered 2023-12-16: 81 mg via ORAL

## 2023-12-16 MED ORDER — MIDAZOLAM HCL 2 MG/2ML IJ SOLN
INTRAMUSCULAR | Status: AC
  Filled 2023-12-16: qty 2

## 2023-12-16 MED ORDER — ONDANSETRON HCL 4 MG/2ML IJ SOLN
4.0000 mg | Freq: Four times a day (QID) | INTRAMUSCULAR | Status: DC | PRN
Start: 2023-12-16 — End: 2023-12-16

## 2023-12-16 MED ORDER — IOHEXOL 300 MG/ML  SOLN
INTRAMUSCULAR | Status: DC | PRN
  Administered 2023-12-16: 44 mL

## 2023-12-16 MED ORDER — SODIUM CHLORIDE 0.9 % WEIGHT BASED INFUSION
3.0000 mL/kg/h | INTRAVENOUS | Status: AC
  Administered 2023-12-16: 3 mL/kg/h via INTRAVENOUS

## 2023-12-16 MED ORDER — HEPARIN (PORCINE) IN NACL 1000-0.9 UT/500ML-% IV SOLN
INTRAVENOUS | Status: AC
  Filled 2023-12-16: qty 1000

## 2023-12-16 MED ORDER — SODIUM CHLORIDE 0.9% FLUSH: 3.0000 mL | Freq: Two times a day (BID) | INTRAVENOUS | Status: DC

## 2023-12-16 MED ORDER — MIDAZOLAM HCL 2 MG/2ML IJ SOLN
INTRAMUSCULAR | Status: DC | PRN
Start: 2023-12-16 — End: 2023-12-16
  Administered 2023-12-16: 1 mg via INTRAVENOUS

## 2023-12-16 MED ORDER — FENTANYL CITRATE (PF) 100 MCG/2ML IJ SOLN
INTRAMUSCULAR | Status: AC
Start: 2023-12-16 — End: 2023-12-16
  Filled 2023-12-16: qty 2

## 2023-12-16 MED ORDER — HEPARIN (PORCINE) IN NACL 1000-0.9 UT/500ML-% IV SOLN
INTRAVENOUS | Status: DC | PRN
  Administered 2023-12-16 (×2): 500 mL

## 2023-12-16 MED ORDER — ASPIRIN 81 MG PO CHEW
CHEWABLE_TABLET | ORAL | Status: AC
  Filled 2023-12-16: qty 1

## 2023-12-16 MED ORDER — HYDRALAZINE HCL 20 MG/ML IJ SOLN: 10.0000 mg | INTRAMUSCULAR | Status: DC | PRN

## 2023-12-16 SURGICAL SUPPLY — 9 items
CATH INFINITI 5 FR JL3.5 (CATHETERS) IMPLANT
CATH INFINITI JR4 5F (CATHETERS) IMPLANT
DEVICE RAD TR BAND REGULAR (VASCULAR PRODUCTS) IMPLANT
DRAPE BRACHIAL (DRAPES) IMPLANT
GLIDESHEATH SLEND A-KIT 6F 22G (SHEATH) IMPLANT
GUIDEWIRE INQWIRE 1.5J.035X260 (WIRE) IMPLANT
PACK CARDIAC CATH (CUSTOM PROCEDURE TRAY) ×1 IMPLANT
SET ATX-X65L (MISCELLANEOUS) IMPLANT
STATION PROTECTION PRESSURIZED (MISCELLANEOUS) IMPLANT

## 2023-12-23 ENCOUNTER — Emergency Department

## 2023-12-23 ENCOUNTER — Inpatient Hospital Stay
Admission: EM | Admit: 2023-12-23 | Discharge: 2023-12-29 | DRG: 812 | Disposition: A | Discharge status: Home Health | Attending: Internal Medicine | Admitting: Internal Medicine

## 2023-12-23 ENCOUNTER — Encounter: Payer: Self-pay | Admitting: Emergency Medicine

## 2023-12-23 ENCOUNTER — Other Ambulatory Visit: Payer: Self-pay

## 2023-12-23 DIAGNOSIS — I480 Paroxysmal atrial fibrillation: Secondary | ICD-10-CM | POA: Diagnosis present

## 2023-12-23 DIAGNOSIS — G629 Polyneuropathy, unspecified: Secondary | ICD-10-CM | POA: Diagnosis not present

## 2023-12-23 DIAGNOSIS — I4892 Unspecified atrial flutter: Secondary | ICD-10-CM | POA: Diagnosis present

## 2023-12-23 DIAGNOSIS — I2510 Atherosclerotic heart disease of native coronary artery without angina pectoris: Secondary | ICD-10-CM | POA: Diagnosis present

## 2023-12-23 DIAGNOSIS — N400 Enlarged prostate without lower urinary tract symptoms: Secondary | ICD-10-CM | POA: Diagnosis not present

## 2023-12-23 DIAGNOSIS — Z7982 Long term (current) use of aspirin: Secondary | ICD-10-CM | POA: Diagnosis not present

## 2023-12-23 DIAGNOSIS — K922 Gastrointestinal hemorrhage, unspecified: Secondary | ICD-10-CM | POA: Diagnosis not present

## 2023-12-23 DIAGNOSIS — Z794 Long term (current) use of insulin: Secondary | ICD-10-CM

## 2023-12-23 DIAGNOSIS — I442 Atrioventricular block, complete: Secondary | ICD-10-CM | POA: Diagnosis not present

## 2023-12-23 DIAGNOSIS — I495 Sick sinus syndrome: Secondary | ICD-10-CM | POA: Diagnosis not present

## 2023-12-23 DIAGNOSIS — D509 Iron deficiency anemia, unspecified: Secondary | ICD-10-CM | POA: Diagnosis not present

## 2023-12-23 DIAGNOSIS — R531 Weakness: Secondary | ICD-10-CM | POA: Diagnosis not present

## 2023-12-23 DIAGNOSIS — D62 Acute posthemorrhagic anemia: Secondary | ICD-10-CM | POA: Diagnosis not present

## 2023-12-23 DIAGNOSIS — E119 Type 2 diabetes mellitus without complications: Secondary | ICD-10-CM

## 2023-12-23 DIAGNOSIS — Z66 Do not resuscitate: Secondary | ICD-10-CM | POA: Diagnosis present

## 2023-12-23 DIAGNOSIS — Z951 Presence of aortocoronary bypass graft: Secondary | ICD-10-CM

## 2023-12-23 DIAGNOSIS — Z604 Social exclusion and rejection: Secondary | ICD-10-CM | POA: Diagnosis present

## 2023-12-23 DIAGNOSIS — E1165 Type 2 diabetes mellitus with hyperglycemia: Secondary | ICD-10-CM | POA: Diagnosis present

## 2023-12-23 DIAGNOSIS — Z7901 Long term (current) use of anticoagulants: Secondary | ICD-10-CM

## 2023-12-23 DIAGNOSIS — K921 Melena: Secondary | ICD-10-CM | POA: Diagnosis not present

## 2023-12-23 DIAGNOSIS — D72819 Decreased white blood cell count, unspecified: Secondary | ICD-10-CM | POA: Diagnosis not present

## 2023-12-23 DIAGNOSIS — D696 Thrombocytopenia, unspecified: Secondary | ICD-10-CM | POA: Diagnosis not present

## 2023-12-23 DIAGNOSIS — R195 Other fecal abnormalities: Secondary | ICD-10-CM | POA: Diagnosis present

## 2023-12-23 DIAGNOSIS — R0989 Other specified symptoms and signs involving the circulatory and respiratory systems: Secondary | ICD-10-CM | POA: Diagnosis not present

## 2023-12-23 DIAGNOSIS — R42 Dizziness and giddiness: Secondary | ICD-10-CM | POA: Diagnosis not present

## 2023-12-23 DIAGNOSIS — Z950 Presence of cardiac pacemaker: Secondary | ICD-10-CM

## 2023-12-23 DIAGNOSIS — R079 Chest pain, unspecified: Secondary | ICD-10-CM | POA: Diagnosis not present

## 2023-12-23 DIAGNOSIS — Z888 Allergy status to other drugs, medicaments and biological substances status: Secondary | ICD-10-CM

## 2023-12-23 DIAGNOSIS — I10 Essential (primary) hypertension: Secondary | ICD-10-CM | POA: Diagnosis not present

## 2023-12-23 DIAGNOSIS — Z87891 Personal history of nicotine dependence: Secondary | ICD-10-CM

## 2023-12-23 DIAGNOSIS — Z974 Presence of external hearing-aid: Secondary | ICD-10-CM

## 2023-12-23 DIAGNOSIS — E785 Hyperlipidemia, unspecified: Secondary | ICD-10-CM | POA: Diagnosis not present

## 2023-12-23 DIAGNOSIS — I2489 Other forms of acute ischemic heart disease: Secondary | ICD-10-CM | POA: Diagnosis present

## 2023-12-23 DIAGNOSIS — Z79899 Other long term (current) drug therapy: Secondary | ICD-10-CM

## 2023-12-23 DIAGNOSIS — I350 Nonrheumatic aortic (valve) stenosis: Secondary | ICD-10-CM | POA: Diagnosis not present

## 2023-12-23 DIAGNOSIS — Z7984 Long term (current) use of oral hypoglycemic drugs: Secondary | ICD-10-CM | POA: Diagnosis not present

## 2023-12-23 DIAGNOSIS — Z955 Presence of coronary angioplasty implant and graft: Secondary | ICD-10-CM

## 2023-12-23 DIAGNOSIS — R55 Syncope and collapse: Principal | ICD-10-CM

## 2023-12-23 DIAGNOSIS — J984 Other disorders of lung: Secondary | ICD-10-CM | POA: Diagnosis not present

## 2023-12-23 LAB — COMPREHENSIVE METABOLIC PANEL WITH GFR
ALT: 8 U/L (ref 0–44)
AST: 12 U/L — ABNORMAL LOW (ref 15–41)
Albumin: 2.6 g/dL — ABNORMAL LOW (ref 3.5–5.0)
Alkaline Phosphatase: 43 U/L (ref 38–126)
Anion gap: 5 (ref 5–15)
BUN: 38 mg/dL — ABNORMAL HIGH (ref 8–23)
CO2: 25 mmol/L (ref 22–32)
Calcium: 8.2 mg/dL — ABNORMAL LOW (ref 8.9–10.3)
Chloride: 104 mmol/L (ref 98–111)
Creatinine, Ser: 1.06 mg/dL (ref 0.61–1.24)
GFR, Estimated: >60 mL/min (ref >60)
Glucose, Bld: 320 mg/dL — ABNORMAL HIGH (ref 70–99)
Potassium: 4.5 mmol/L (ref 3.5–5.1)
Sodium: 134 mmol/L — ABNORMAL LOW (ref 135–145)
Total Bilirubin: 0.5 mg/dL (ref 0.0–1.2)
Total Protein: 4.2 g/dL — ABNORMAL LOW (ref 6.5–8.1)

## 2023-12-23 LAB — CBC
HCT: 12.9 % — CL (ref 39.0–52.0)
Hemoglobin: 4.2 g/dL — CL (ref 13.0–17.0)
MCH: 30 pg (ref 26.0–34.0)
MCHC: 32.6 g/dL (ref 30.0–36.0)
MCV: 92.1 fL (ref 80.0–100.0)
Platelets: 131 K/uL — ABNORMAL LOW (ref 150–400)
RBC: 1.4 MIL/uL — ABNORMAL LOW (ref 4.22–5.81)
RDW: 17.8 % — ABNORMAL HIGH (ref 11.5–15.5)
WBC: 3.2 K/uL — ABNORMAL LOW (ref 4.0–10.5)
nRBC: 0 % (ref 0.0–0.2)

## 2023-12-23 LAB — CBG MONITORING, ED
Glucose-Capillary: 282 mg/dL — ABNORMAL HIGH (ref 70–99)
Glucose-Capillary: 227 mg/dL — ABNORMAL HIGH (ref 70–99)

## 2023-12-23 LAB — PREPARE RBC (CROSSMATCH)

## 2023-12-23 LAB — IRON AND TIBC
Iron: 22 ug/dL — ABNORMAL LOW (ref 45–182)
Saturation Ratios: 7 % — ABNORMAL LOW (ref 17.9–39.5)
TIBC: 298 ug/dL (ref 250–450)
UIBC: 276 ug/dL

## 2023-12-23 LAB — TROPONIN I (HIGH SENSITIVITY)
Troponin I (High Sensitivity): 94 ng/L — ABNORMAL HIGH (ref <18)
Troponin I (High Sensitivity): 171 ng/L (ref <18)

## 2023-12-23 LAB — ABO/RH: ABO/RH(D): A POS

## 2023-12-23 LAB — GLUCOSE, CAPILLARY: Glucose-Capillary: 306 mg/dL — ABNORMAL HIGH (ref 70–99)

## 2023-12-23 LAB — FOLATE: Folate: 11.2 ng/mL (ref >5.9)

## 2023-12-23 LAB — FERRITIN: Ferritin: 30 ng/mL (ref 24–336)

## 2023-12-23 MED ORDER — ACETAMINOPHEN 325 MG PO TABS: 650.0000 mg | ORAL_TABLET | Freq: Four times a day (QID) | ORAL | Status: DC | PRN

## 2023-12-23 MED ORDER — ENSURE PLUS HIGH PROTEIN PO LIQD
237.0000 mL | Freq: Two times a day (BID) | ORAL | Status: DC
  Administered 2023-12-23 – 2023-12-27 (×8): 237 mL via ORAL

## 2023-12-23 MED ORDER — GABAPENTIN 400 MG PO CAPS
1200.0000 mg | ORAL_CAPSULE | Freq: Every day | ORAL | Status: DC
  Administered 2023-12-23 – 2023-12-28 (×6): 1200 mg via ORAL
  Filled 2023-12-23 (×6): qty 3

## 2023-12-23 MED ORDER — GABAPENTIN 600 MG PO TABS
600.0000 mg | ORAL_TABLET | ORAL | Status: DC
Start: 2023-12-23 — End: 2023-12-23

## 2023-12-23 MED ORDER — INSULIN ASPART 100 UNIT/ML IJ SOLN
0.0000 [IU] | Freq: Three times a day (TID) | INTRAMUSCULAR | Status: DC
  Administered 2023-12-23: 3 [IU] via SUBCUTANEOUS
  Administered 2023-12-24 (×2): 5 [IU] via SUBCUTANEOUS
  Administered 2023-12-24: 2 [IU] via SUBCUTANEOUS
  Administered 2023-12-25 (×2): 5 [IU] via SUBCUTANEOUS
  Administered 2023-12-25: 2 [IU] via SUBCUTANEOUS
  Administered 2023-12-26 (×2): 5 [IU] via SUBCUTANEOUS
  Administered 2023-12-26 – 2023-12-27 (×2): 2 [IU] via SUBCUTANEOUS
  Administered 2023-12-27: 1 [IU] via SUBCUTANEOUS
  Administered 2023-12-27: 7 [IU] via SUBCUTANEOUS
  Administered 2023-12-28: 3 [IU] via SUBCUTANEOUS
  Administered 2023-12-28: 2 [IU] via SUBCUTANEOUS
  Administered 2023-12-28: 3 [IU] via SUBCUTANEOUS
  Administered 2023-12-29: 2 [IU] via SUBCUTANEOUS
  Filled 2023-12-23 (×8): qty 1
  Filled 2023-12-23: qty 3
  Filled 2023-12-23 (×6): qty 1

## 2023-12-23 MED ORDER — ONDANSETRON HCL 4 MG/2ML IJ SOLN: 4.0000 mg | Freq: Four times a day (QID) | INTRAMUSCULAR | Status: DC | PRN

## 2023-12-23 MED ORDER — PANTOPRAZOLE SODIUM 40 MG IV SOLR
40.0000 mg | Freq: Two times a day (BID) | INTRAVENOUS | Status: DC
  Administered 2023-12-23 – 2023-12-29 (×12): 40 mg via INTRAVENOUS
  Filled 2023-12-23 (×12): qty 10

## 2023-12-23 MED ORDER — GABAPENTIN 300 MG PO CAPS
600.0000 mg | ORAL_CAPSULE | Freq: Every morning | ORAL | Status: DC
  Administered 2023-12-24 – 2023-12-29 (×6): 600 mg via ORAL
  Filled 2023-12-23 (×6): qty 2

## 2023-12-23 MED ORDER — PRAVASTATIN SODIUM 40 MG PO TABS
40.0000 mg | ORAL_TABLET | Freq: Every day | ORAL | Status: DC
  Administered 2023-12-23 – 2023-12-28 (×6): 40 mg via ORAL
  Filled 2023-12-23 (×6): qty 1

## 2023-12-23 MED ORDER — INSULIN ASPART 100 UNIT/ML IJ SOLN
0.0000 [IU] | Freq: Every day | INTRAMUSCULAR | Status: DC
  Administered 2023-12-23: 4 [IU] via SUBCUTANEOUS
  Administered 2023-12-24: 2 [IU] via SUBCUTANEOUS
  Administered 2023-12-27: 3 [IU] via SUBCUTANEOUS
  Filled 2023-12-23 (×3): qty 1

## 2023-12-23 MED ORDER — ONDANSETRON HCL 4 MG PO TABS: 4.0000 mg | ORAL_TABLET | Freq: Four times a day (QID) | ORAL | Status: DC | PRN

## 2023-12-23 MED ORDER — PANTOPRAZOLE SODIUM 40 MG IV SOLR
40.0000 mg | Freq: Once | INTRAVENOUS | Status: AC
  Administered 2023-12-23: 40 mg via INTRAVENOUS
  Filled 2023-12-23: qty 10

## 2023-12-23 MED ORDER — ACETAMINOPHEN 500 MG PO TABS
1000.0000 mg | ORAL_TABLET | Freq: Four times a day (QID) | ORAL | Status: DC | PRN
  Administered 2023-12-29: 1000 mg via ORAL
  Filled 2023-12-23: qty 2

## 2023-12-23 MED ORDER — ACETAMINOPHEN 650 MG RE SUPP: 650.0000 mg | Freq: Four times a day (QID) | RECTAL | Status: DC | PRN

## 2023-12-23 MED ORDER — LACTATED RINGERS IV SOLN: INTRAVENOUS | Status: DC

## 2023-12-23 MED ORDER — SODIUM CHLORIDE 0.9% IV SOLUTION
Freq: Once | INTRAVENOUS | Status: DC
Start: 2023-12-23 — End: 2023-12-29
  Filled 2023-12-23: qty 250

## 2023-12-23 NOTE — ED Provider Notes (Signed)
 [facility] Provider Note    Event Date/Time   First MD Initiated Contact with Patient 12/23/23 1128     (approximate)   History   Loss of Consciousness   HPI  Eduardo Ashley is a 89 year old male with history of HTN, DM, aortic stenosis, complete heart block s/p recent pacemaker placement presenting to the emergency department for evaluation of weakness.  This morning, patient had onset of generalized weakness with associated lightheadedness and 2 syncopal episodes.  Family noticed that the patient appeared pale yesterday and today.  Has noticed dark stools over the last week.  Reviewed discharge summary from 11/28/2023.  At that time patient presented to an elective cardioversion and was found to be in complete heart block, admitted for permanent pacemaker placement.  Was continued on Eliquis  for atrial flutter.  Also awaiting outpatient evaluation for aortic valve stenosis.     Physical Exam   Triage Vital Signs: ED Triage Vitals  Encounter Vitals Group     BP 12/23/23 1059 (!) 106/43     Girls Systolic BP Percentile --      Girls Diastolic BP Percentile --      Boys Systolic BP Percentile --      Boys Diastolic BP Percentile --      Pulse Rate 12/23/23 1059 63     Resp 12/23/23 1059 20     Temp 12/23/23 1059 97.8 F (36.6 C)     Temp Source 12/23/23 1059 Oral     SpO2 12/23/23 1059 96 %     Weight 12/23/23 1056 180 lb (81.6 kg)     Height 12/23/23 1056 6' (1.829 m)     Head Circumference --      Peak Flow --      Pain Score 12/23/23 1056 0     Pain Loc --      Pain Education --      Exclude from Growth Chart --     Most recent vital signs: Vitals:   12/23/23 1309 12/23/23 1324  BP: (!) 117/46 (!) 133/50  Pulse: 63 63  Resp: (!) 21 19  Temp: 97.7 F (36.5 C) 98.1 F (36.7 C)  SpO2:  100%     General: Awake, interactive, pale CV:  Regular rate, good peripheral perfusion.  Resp:  Unlabored respirations, lungs clear to  auscultation  Abd:  Nondistended, soft, nontender, dark stool on rectal exam, Hemoccult positive Neuro:  Symmetric facial movement, fluid speech   ED Results / Procedures / Treatments   Labs (all labs ordered are listed, but only abnormal results are displayed) Labs Reviewed  COMPREHENSIVE METABOLIC PANEL WITH GFR - Abnormal; Notable for the following components:      Result Value   Sodium 134 (*)    Glucose, Bld 320 (*)    BUN 38 (*)    Calcium 8.2 (*)    Total Protein 4.2 (*)    Albumin 2.6 (*)    AST 12 (*)    All other components within normal limits  CBC - Abnormal; Notable for the following components:   WBC 3.2 (*)    RBC 1.40 (*)    Hemoglobin 4.2 (*)    HCT 12.9 (*)    RDW 17.8 (*)    Platelets 131 (*)    All other components within normal limits  CBG MONITORING, ED - Abnormal; Notable for the following components:   Glucose-Capillary 282 (*)    All other components within normal limits  TROPONIN I (HIGH SENSITIVITY) - Abnormal; Notable for the following components:   Troponin I (High Sensitivity) 94 (*)    All other components within normal limits  URINALYSIS, ROUTINE W REFLEX MICROSCOPIC  TYPE AND SCREEN  PREPARE RBC (CROSSMATCH)  ABO/RH  TROPONIN I (HIGH SENSITIVITY)     EKG EKG independently reviewed and interpreted by myself demonstrates:  EKG demonstrates paced rhythm at a rate of 65, QRS 178, QTc 488, no appreciable superimposed ischemia  RADIOLOGY Imaging independently reviewed and interpreted by myself demonstrates:   Formal Radiology Read:  DG Chest Portable 1 View Result Date: 12/23/2023 CLINICAL DATA:  Weakness EXAM: PORTABLE CHEST 1 VIEW COMPARISON:  11/30/2023 FINDINGS: The list pacer noted. Atherosclerotic calcification of the aortic arch. Mild enlargement of the cardiopericardial silhouette Mild scarring peripherally at the left lung base. Mildly low lung volumes. Lower thoracic spondylosis. IMPRESSION: 1. Mild enlargement of the  cardiopericardial silhouette, without edema. 2. Mild scarring peripherally at the left lung base. 3. Lower thoracic spondylosis. Electronically Signed   By: Ryan Salvage M.D.   On: 12/23/2023 13:05    PROCEDURES:  Critical Care performed: Yes, see critical care procedure note(s)  CRITICAL CARE Performed by: Nilsa Dade   Total critical care time: 32 minutes  Critical care time was exclusive of separately billable procedures and treating other patients.  Critical care was necessary to treat or prevent imminent or life-threatening deterioration.  Critical care was time spent personally by me on the following activities: development of treatment plan with patient and/or surrogate as well as nursing, discussions with consultants, evaluation of patient's response to treatment, examination of patient, obtaining history from patient or surrogate, ordering and performing treatments and interventions, ordering and review of laboratory studies, ordering and review of radiographic studies, pulse oximetry and re-evaluation of patient's condition.   Procedures   MEDICATIONS ORDERED IN ED: Medications  0.9 %  sodium chloride  infusion (Manually program via Guardrails IV Fluids) (has no administration in time range)  pantoprazole  (PROTONIX ) injection 40 mg (40 mg Intravenous Given 12/23/23 1219)     IMPRESSION / MDM / ASSESSMENT AND PLAN / ED COURSE  I reviewed the triage vital signs and the nursing notes.  Differential diagnosis includes, but is not limited to, anemia, electrolyte abnormality, UTI, other infection, ACS  Patient's presentation is most consistent with acute presentation with potential threat to life or bodily function.  89 year old male presenting with weakness.  Stable vitals on presentation with low normal blood pressure.  Labs with concerning CBC with hemoglobin of 4.2, down from 9.83 weeks ago.  Mild leukopenia noted with stable mild thrombocytopenia.  CMP with  hyperglycemia without evidence of DKA.  Troponin elevated at 94, uptrending from a few weeks ago likely demand related in the setting of significant hemoglobin drop.  Patient assessed, pale, does have dark Hemoccult positive stool on rectal exam.  I am concerned about GI bleed in the setting of his Eliquis  use.  Last dose this morning.  He had Ackley stable, do not think there is an indication for Kcentra currently, but patient is agreeable for blood transfusion.  Ordered for 2 units of PRBCs as well as IV Protonix .  Patient arrives with a DNR and confirms goldenrod, would also like to be a DNI, but is interested in other interventions including possible endoscopy/colonoscopy if recommended.  Reach out to hospitalist team to discuss admission.  Clinical Course as of 12/23/23 1338  Wed Dec 23, 2023  1319 Case discussed with hospitalist team. They will  evaluate for anticipated admission. [NR]    Clinical Course User Index [NR] Levander Slate, MD     FINAL CLINICAL IMPRESSION(S) / ED DIAGNOSES   Final diagnoses:  Syncope and collapse  Gastrointestinal hemorrhage, unspecified gastrointestinal hemorrhage type     Rx / DC Orders   ED Discharge Orders     None        Note:  This document was prepared using Dragon voice recognition software and may include unintentional dictation errors.   Levander Slate, MD 12/23/23 [phone]

## 2023-12-23 NOTE — H&P (Signed)
 History and Physical    Patient: Eduardo Ashley FMW:[id number] DOB: 04/23/1930 DOA: 12/23/2023 DOS: the patient was seen and examined on 12/23/2023 PCP: Don Lauraine Collar, NP  Patient coming from: Home  Chief Complaint:  Chief Complaint  Patient presents with   Loss of Consciousness   HPI: Eduardo Ashley is a 89 y.o. 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     He is presenting today for evaluation of worsening weakness and black tarry stools for the past week or so.  Denies abdominal pain, nausea, vomiting, diarrhea, and syncope.  He has had persistent dizziness with standing which is not new. No history of GI bleeding  In the emergency department, his initial blood pressure was soft but fluid responsive.  Hemoglobin 4.2, down from 9.8 less than a month ago.  Hemoccult positive.  Received transfusion during my encounter. Will admit to hospitalist service with plans for upper and lower endoscopy with possible video capsule endoscopy Friday.   I confirmed with him and his son present at bedside he is a DNR  Review of Systems: Review of Systems  Constitutional:  Positive for malaise/fatigue. Negative for chills, fever and weight loss.  Eyes:  Negative for blurred vision.  Respiratory:  Negative for cough, sputum production and shortness of breath.   Cardiovascular:  Negative for chest pain and leg swelling.  Gastrointestinal:  Positive for melena. Negative for abdominal pain, heartburn, nausea and vomiting.  Genitourinary:  Negative for dysuria and frequency.  Musculoskeletal:  Negative for back pain and myalgias.  Skin:  Negative for itching and rash.  Neurological:  Positive for dizziness and  weakness. Negative for sensory change, speech change, focal weakness and loss of consciousness.    Past Medical History:  Diagnosis Date   Aortic valve stenosis    Carotid artery stenosis    Coronary artery disease    Diabetes mellitus without complication (HCC)    GERD (gastroesophageal reflux disease)    Hypertension    Neuropathy    feet   Wears hearing aid in both ears    Past Surgical History:  Procedure Laterality Date   CARDIOVERSION N/A 11/26/2023   Procedure: CARDIOVERSION;  Surgeon: Hilarie Rocher, MD;  Location: [facility] ORS;  Service: Cardiovascular;  Laterality: N/A;   LEFT HEART CATH AND CORONARY ANGIOGRAPHY Left 06/25/2017   Procedure: LEFT HEART CATH AND CORONARY ANGIOGRAPHY;  Surgeon: Hester Wolm PARAS, MD;  Location: [facility] INVASIVE CV LAB;  Service: Cardiovascular;  Laterality: Left;   LEFT HEART CATH AND CORONARY ANGIOGRAPHY Left 12/16/2023   Procedure: LEFT HEART CATH AND CORONARY ANGIOGRAPHY;  Surgeon: Katina Albright, MD;  Location: [facility] INVASIVE CV LAB;  Service: Cardiovascular;  Laterality: Left;   PACEMAKER LEADLESS INSERTION N/A 11/27/2023   Procedure: PACEMAKER LEADLESS INSERTION;  Surgeon: Ammon Blunt, MD;  Location: [facility] INVASIVE CV LAB;  Service: Cardiovascular;  Laterality: N/A;   Social History:  reports that he quit smoking about 56 years ago. His smoking use included cigarettes. He has never used smokeless tobacco. He reports that he does not drink alcohol and does not use drugs.  Allergies  Allergen Reactions   Hydrochlorothiazide     Muscle cramps   Lisinopril Cough   Lovastatin     Muscle pain    History reviewed. No  pertinent family history.  Prior to Admission medications   Medication Sig Start Date End Date Taking? Authorizing Provider  acetaminophen  (TYLENOL ) 500 MG tablet Take 1,000 mg by mouth every 6 (six) hours as needed for moderate pain or headache.   Yes [provider]  apixaban  (ELIQUIS ) 5 MG TABS tablet Take 5 mg by  mouth 2 (two) times daily.   Yes [provider]  Ascorbic Acid (VITAMIN C PO) Take 1,000 mg by mouth daily. 500 mg   Yes [provider]  aspirin  EC 81 MG tablet Take 81 mg by mouth daily.   Yes [provider]  cholecalciferol (VITAMIN D3) 25 MCG (1000 UNIT) tablet Take 1,000 Units by mouth daily.   Yes [provider]  finasteride (PROSCAR) 5 MG tablet Take 5 mg by mouth daily.   Yes [provider]  gabapentin  (NEURONTIN ) 600 MG tablet Take 600-1,200 mg by mouth See admin instructions. Take 600 mg by mouth in the morning and take 1200 mg by mouth at bedtime   Yes [provider]  glimepiride (AMARYL) 2 MG tablet Take 2-4 mg by mouth See admin instructions. Take 4 mg at breakfast and 2 mg at dinner   Yes [provider]  irbesartan  (AVAPRO ) 300 MG tablet Take 300 mg by mouth daily.   Yes [provider]  magnesium  oxide (MAG-OX) 400 MG tablet Take 400 mg by mouth daily.   Yes [provider]  metFORMIN (GLUCOPHAGE) 1000 MG tablet Take 1,000 mg by mouth 2 (two) times daily with a meal.   Yes [provider]  metoprolol  succinate (TOPROL -XL) 25 MG 24 hr tablet Take 25 mg by mouth daily.   Yes [provider]  pravastatin  (PRAVACHOL ) 40 MG tablet Take 40 mg by mouth at bedtime.   Yes [provider]  sitaGLIPtin (JANUVIA) 100 MG tablet Take 100 mg by mouth daily.   Yes [provider]  tamsulosin  (FLOMAX ) 0.4 MG CAPS capsule Take 0.8 mg by mouth at bedtime.   Yes [provider]  vitamin B-12 (CYANOCOBALAMIN) 500 MCG tablet Take 500 mcg by mouth daily.   Yes [provider]    Physical Exam: Vitals:   12/23/23 1056 12/23/23 1059 12/23/23 1135 12/23/23 1200  BP:  (!) 106/43 (!) 108/48 (!) 107/48  Pulse:  63 65 63  Resp:  20 15 (!) 21  Temp:  97.8 F (36.6 C)    TempSrc:  Oral    SpO2:  96% 94% 99%  Weight: 81.6 kg     Height: 6' (1.829 m)     Physical  Exam Vitals and nursing note reviewed.  HENT:     Head: Normocephalic and atraumatic.  Eyes:     General: No scleral icterus.    Extraocular Movements: Extraocular movements intact.     Pupils: Pupils are equal, round, and reactive to light.  Cardiovascular:     Rate and Rhythm: Regular rhythm. Tachycardia present.  Pulmonary:     Effort: Pulmonary effort is normal. No respiratory distress.     Breath sounds: Normal breath sounds. No wheezing.  Abdominal:     General: Bowel sounds are normal. There is no distension.     Palpations: Abdomen is soft.     Tenderness: There is no abdominal tenderness.  Musculoskeletal:        General: Normal range of motion.     Cervical back: Neck supple.     Right lower leg: No edema.  Left lower leg: No edema.  Skin:    Capillary Refill: Capillary refill takes less than 2 seconds.     Coloration: Skin is pale.  Neurological:     Mental Status: He is alert. Mental status is at baseline.  Psychiatric:        Mood and Affect: Mood normal.        Behavior: Behavior normal.     Data Reviewed:   Labs on Admission: I have personally reviewed following labs and imaging studies  CBC: Recent Labs  Lab 12/23/23 1104  WBC 3.2*  HGB 4.2*  HCT 12.9*  MCV 92.1  PLT 131*   Basic Metabolic Panel: Recent Labs  Lab 12/23/23 1104  NA 134*  K 4.5  CL 104  CO2 25  GLUCOSE 320*  BUN 38*  CREATININE 1.06  CALCIUM 8.2*   GFR: Estimated Creatinine Clearance: 50.8 mL/min (by C-G formula based on SCr of 1.06 mg/dL). Liver Function Tests: Recent Labs  Lab 12/23/23 1104  AST 12*  ALT 8  ALKPHOS 43  BILITOT 0.5  PROT 4.2*  ALBUMIN 2.6*   No results for input(s): LIPASE, AMYLASE in the last 168 hours. No results for input(s): AMMONIA in the last 168 hours. Coagulation Profile: No results for input(s): INR, PROTIME in the last 168 hours. Cardiac Enzymes: No results for input(s): CKTOTAL, CKMB, CKMBINDEX, TROPONINI in  the last 168 hours. BNP (last 3 results) No results for input(s): PROBNP in the last 8760 hours. HbA1C: No results for input(s): HGBA1C in the last 72 hours. CBG: Recent Labs  Lab 12/23/23 1141  GLUCAP 282*   Lipid Profile: No results for input(s): CHOL, HDL, LDLCALC, TRIG, CHOLHDL, LDLDIRECT in the last 72 hours. Thyroid  Function Tests: No results for input(s): TSH, T4TOTAL, FREET4, T3FREE, THYROIDAB in the last 72 hours. Anemia Panel: No results for input(s): VITAMINB12, FOLATE, FERRITIN, TIBC, IRON , RETICCTPCT in the last 72 hours. Urine analysis: No results found for: COLORURINE, APPEARANCEUR, LABSPEC, PHURINE, GLUCOSEU, HGBUR, BILIRUBINUR, KETONESUR, PROTEINUR, UROBILINOGEN, NITRITE, LEUKOCYTESUR  Radiological Exams on Admission: DG Chest Portable 1 View Result Date: 12/23/2023 CLINICAL DATA:  Weakness EXAM: PORTABLE CHEST 1 VIEW COMPARISON:  11/30/2023 FINDINGS: The list pacer noted. Atherosclerotic calcification of the aortic arch. Mild enlargement of the cardiopericardial silhouette Mild scarring peripherally at the left lung base. Mildly low lung volumes. Lower thoracic spondylosis. IMPRESSION: 1. Mild enlargement of the cardiopericardial silhouette, without edema. 2. Mild scarring peripherally at the left lung base. 3. Lower thoracic spondylosis. Electronically Signed   By: Ryan Salvage M.D.   On: 12/23/2023 13:05      Assessment and Plan: No notes have been filed under this hospital service. Service: Hospitalist  89 y.o. male with medical history significant of severe symptomatic  aortic stenosis pending TAVR, complete heart block s/p Micra leadless PPM (11/27/2023), paroxsymal atrial fibrillation/flutter s/p DCCV and ablation on eliquis , coronary artery disease s/p stent to LAD (01/2011), hypertension, hyperlipidemia, type 2 diabetes admitted for acute on chronic anemia and concern for UGIB.    Acute on  chronic anemia, on eliquis   - suspect UGIB. Hemoglobin 4.2, down from 9.8 less than a month ago.  Hemoccult positive, hemodynamically stable.   - Receiving 3 units PRBC currently , monitor hgb closely and transfuse as indicated to maintain Hgb >8  - GI consulted, appreciate recommendations.  Plan for upper and lower endoscopy with possible pill endoscopy on Friday  - holding antihypertensives   -Hold Eliquis  and asa, last dose 9/10 am  -  IV protonix  - CLD, IVF   Paroxysmal A-fib atrial fibrillation status post DCCV on Eliquis  Third-degree heart block status post PPM CAD s/p PCI Severe symptomatic aortic stenosis pending TAVR evaluation HTN  - Holding Eliquis , asa,  and GDMT/antihypertensives  given the above -Mild troponin elevation likely demand ischemia in the setting of above, chest pain-free - cont lovastatin   T2DM - SSI    SCDs LR@100  CLD  Monitor/replace electrolytes       Advance Care Planning:   Code Status: Prior discussed with patient and son at time of admission, has goldenrod.   Consults: GI   Family Communication: Son present at bedside   Severity of Illness: The appropriate patient status for this patient is INPATIENT. Inpatient status is judged to be reasonable and necessary in order to provide the required intensity of service to ensure the patient's safety. The patient's presenting symptoms, physical exam findings, and initial radiographic and laboratory data in the context of their chronic comorbidities is felt to place them at high risk for further clinical deterioration. Furthermore, it is not anticipated that the patient will be medically stable for discharge from the hospital within 2 midnights of admission.   * I certify that at the point of admission it is my clinical judgment that the patient will require inpatient hospital care spanning beyond 2 midnights from the point of admission due to high intensity of service, high risk for further deterioration  and high frequency of surveillance required.*  Author: Daved JAYSON Pump, DO 12/23/2023 1:07 PM  For on call review [email or web address].

## 2023-12-23 NOTE — ED Triage Notes (Signed)
 First Nurse Note:  Pt via [facility] from home. Pt had 2 syncopal episodes this morning. Reports he did not fall pt was eased to a chair. EMS states pt report some CP but now it has resolved. Pt c/o dizziness at this time. EMS gave pt of fluid. Pt is A&Ox4 and NAD 404 CBG 100/39  71 paced HR  98% on 2L [place]

## 2023-12-23 NOTE — ED Notes (Addendum)
 Critical Result: Hgb 4.2 and HCT 12.9  Ray, MD aware

## 2023-12-23 NOTE — ED Notes (Signed)
 Called CCMD to place pt on central monitoring

## 2023-12-23 NOTE — ED Notes (Signed)
 Assisted MD with rectal exam. Positive hemoccult.

## 2023-12-23 NOTE — Consult Note (Signed)
 Corinn JONELLE Brooklyn, MD [address]  [place], [place] [address]  Main: [phone] Fax:  [phone] Pager: [phone]   Consultation  Referring Provider:     No ref. provider found Primary Care Physician:  Don Lauraine Collar, NP Primary Gastroenterologist: Sampson         Reason for Consultation:     Severe symptomatic anemia  Date of Admission:  12/23/2023 Date of Consultation:  12/23/2023         HPI:   Eduardo Ashley is a 89 y.o. male with history of hypertension, diabetes, aortic stenosis, complete heart block status post permanent pacemaker, A-flutter on Eliquis  presented with 2 weeks history of progressively worsening generalized weakness, lightheadedness as well as 2 syncopal episodes.  He was appeared to be pale by the time and has been noticing dark stools over the last week.  He is currently awaiting TAVR procedure for aortic valve stenosis.  Labs revealed elevated BUN/creatinine ratio, hemoglobin of 4.2, MCV 92.1, platelets 131 with a significant drop from 9.83 weeks ago.  Patient lives independently at home.  His son and brother are bedside.  Denies any Tylenol  use, does not smoke or drink alcohol Receiving blood transfusion  NSAIDs: None  Antiplts/Anticoagulants/Anti thrombotics: Eliquis   GI Procedures: None  Past Medical History:  Diagnosis Date   Aortic valve stenosis    Carotid artery stenosis    Coronary artery disease    Diabetes mellitus without complication (HCC)    GERD (gastroesophageal reflux disease)    Hypertension    Neuropathy    feet   Wears hearing aid in both ears     Past Surgical History:  Procedure Laterality Date   CARDIOVERSION N/A 11/26/2023   Procedure: CARDIOVERSION;  Surgeon: Hilarie Rocher, MD;  Location: [facility] ORS;  Service: Cardiovascular;  Laterality: N/A;   LEFT HEART CATH AND CORONARY ANGIOGRAPHY Left 06/25/2017   Procedure: LEFT HEART CATH AND CORONARY ANGIOGRAPHY;  Surgeon: Hester Wolm PARAS, MD;  Location: [facility]  INVASIVE CV LAB;  Service: Cardiovascular;  Laterality: Left;   LEFT HEART CATH AND CORONARY ANGIOGRAPHY Left 12/16/2023   Procedure: LEFT HEART CATH AND CORONARY ANGIOGRAPHY;  Surgeon: Katina Albright, MD;  Location: [facility] INVASIVE CV LAB;  Service: Cardiovascular;  Laterality: Left;   PACEMAKER LEADLESS INSERTION N/A 11/27/2023   Procedure: PACEMAKER LEADLESS INSERTION;  Surgeon: Ammon Blunt, MD;  Location: [facility] INVASIVE CV LAB;  Service: Cardiovascular;  Laterality: N/A;     Current Facility-Administered Medications:    0.9 %  sodium chloride  infusion (Manually program via Guardrails IV Fluids), , Intravenous, Once, Levander Slate, MD, Held at 12/23/23 1435   feeding supplement (ENSURE PLUS HIGH PROTEIN) liquid 237 mL, 237 mL, Oral, BID BM, Sabrie Moritz Reddy, MD  Current Outpatient Medications:    acetaminophen  (TYLENOL ) 500 MG tablet, Take 1,000 mg by mouth every 6 (six) hours as needed for moderate pain or headache., Disp: , Rfl:    apixaban  (ELIQUIS ) 5 MG TABS tablet, Take 5 mg by mouth 2 (two) times daily., Disp: , Rfl:    Ascorbic Acid (VITAMIN C PO), Take 1,000 mg by mouth daily. 500 mg, Disp: , Rfl:    aspirin  EC 81 MG tablet, Take 81 mg by mouth daily., Disp: , Rfl:    cholecalciferol (VITAMIN D3) 25 MCG (1000 UNIT) tablet, Take 1,000 Units by mouth daily., Disp: , Rfl:    finasteride (PROSCAR) 5 MG tablet, Take 5 mg by mouth daily., Disp: , Rfl:    gabapentin  (NEURONTIN ) 600 MG  tablet, Take 600-1,200 mg by mouth See admin instructions. Take 600 mg by mouth in the morning and take 1200 mg by mouth at bedtime, Disp: , Rfl:    glimepiride (AMARYL) 2 MG tablet, Take 2-4 mg by mouth See admin instructions. Take 4 mg at breakfast and 2 mg at dinner, Disp: , Rfl:    irbesartan  (AVAPRO ) 300 MG tablet, Take 300 mg by mouth daily., Disp: , Rfl:    magnesium  oxide (MAG-OX) 400 MG tablet, Take 400 mg by mouth daily., Disp: , Rfl:    metFORMIN (GLUCOPHAGE) 1000 MG tablet, Take 1,000 mg by mouth 2  (two) times daily with a meal., Disp: , Rfl:    metoprolol  succinate (TOPROL -XL) 25 MG 24 hr tablet, Take 25 mg by mouth daily., Disp: , Rfl:    pravastatin  (PRAVACHOL ) 40 MG tablet, Take 40 mg by mouth at bedtime., Disp: , Rfl:    sitaGLIPtin (JANUVIA) 100 MG tablet, Take 100 mg by mouth daily., Disp: , Rfl:    tamsulosin  (FLOMAX ) 0.4 MG CAPS capsule, Take 0.8 mg by mouth at bedtime., Disp: , Rfl:    vitamin B-12 (CYANOCOBALAMIN) 500 MCG tablet, Take 500 mcg by mouth daily., Disp: , Rfl:    History reviewed. No pertinent family history.   Social History   Tobacco Use   Smoking status: Former    Current packs/day: 0.00    Types: Cigarettes    Quit date: 1969    Years since quitting: 56.7   Smokeless tobacco: Never  Vaping Use   Vaping status: Never Used  Substance Use Topics   Alcohol use: No   Drug use: Never    Allergies as of 12/23/2023 - Review Complete 12/23/2023  Allergen Reaction Noted   Hydrochlorothiazide  02/06/2020   Lisinopril Cough 02/06/2020   Lovastatin  02/06/2020    Review of Systems:    All systems reviewed and negative except where noted in HPI.   Physical Exam:  Vital signs in last 24 hours: Temp:  [97.7 F (36.5 C)-98.1 F (36.7 C)] 98.1 F (36.7 C) (09/10 1324) Pulse Rate:  [58-65] 64 (09/10 1500) Resp:  [15-21] 20 (09/10 1500) BP: (105-133)/(43-52) 105/52 (09/10 1500) SpO2:  [94 %-100 %] 99 % (09/10 1500) Weight:  [81.6 kg] 81.6 kg (09/10 1056)   General:   Alert,  Well-developed, well-nourished, pleasant and cooperative in NAD Eyes:  Sclera clear, no icterus.   Conjunctiva pink. Lungs:  Respirations even and unlabored.  Clear throughout to auscultation.   No wheezes, crackles, or rhonchi. No acute distress. Heart:  Regular rate and rhythm; no murmurs, clicks, rubs, or gallops. Abdomen:  Normal bowel sounds. Soft, non-tender and non-distended without masses, hepatosplenomegaly or hernias noted.  No guarding or rebound tenderness.   Rectal:  Not performed Extremities:  No clubbing or edema.  No cyanosis. Neurologic:  Alert and oriented x3 Skin:  Intact without significant lesions or rashes. No jaundice. Psych:  Alert and cooperative. Normal mood and affect.  LAB RESULTS:    Latest Ref Rng & Units 12/23/2023   11:04 AM 11/30/2023    2:46 PM 11/28/2023    5:12 AM  CBC  WBC 4.0 - 10.5 K/uL 3.2  8.8  4.3   Hemoglobin 13.0 - 17.0 g/dL 4.2  9.8  88.9   Hematocrit 39.0 - 52.0 % 12.9  28.4  32.7   Platelets 150 - 400 K/uL 131  138  115     BMET    Latest Ref Rng & Units  12/23/2023   11:04 AM 11/30/2023    5:29 PM 11/30/2023    2:46 PM  BMP  Glucose 70 - 99 mg/dL 679   776   BUN 8 - 23 mg/dL 38   37   Creatinine 9.38 - 1.24 mg/dL 8.93  7.91  7.84   Sodium 135 - 145 mmol/L 134   136   Potassium 3.5 - 5.1 mmol/L 4.5   4.4   Chloride 98 - 111 mmol/L 104   100   CO2 22 - 32 mmol/L 25   23   Calcium 8.9 - 10.3 mg/dL 8.2   8.9     LFT    Latest Ref Rng & Units 12/23/2023   11:04 AM 11/30/2023    2:46 PM 11/27/2023    1:45 AM  Hepatic Function  Total Protein 6.5 - 8.1 g/dL 4.2  5.6  5.7   Albumin 3.5 - 5.0 g/dL 2.6  3.4  3.7   AST 15 - 41 U/L 12  23  14    ALT 0 - 44 U/L 8  11  9    Alk Phosphatase 38 - 126 U/L 43  49  56   Total Bilirubin 0.0 - 1.2 mg/dL 0.5  0.5  0.6      STUDIES: DG Chest Portable 1 View Result Date: 12/23/2023 CLINICAL DATA:  Weakness EXAM: PORTABLE CHEST 1 VIEW COMPARISON:  11/30/2023 FINDINGS: The list pacer noted. Atherosclerotic calcification of the aortic arch. Mild enlargement of the cardiopericardial silhouette Mild scarring peripherally at the left lung base. Mildly low lung volumes. Lower thoracic spondylosis. IMPRESSION: 1. Mild enlargement of the cardiopericardial silhouette, without edema. 2. Mild scarring peripherally at the left lung base. 3. Lower thoracic spondylosis. Electronically Signed   By: Ryan Salvage M.D.   On: 12/23/2023 13:05      Impression / Plan:   Eduardo Ashley is  a 89 y.o. male with severe aortic stenosis, currently undergoing evaluation for TAVR, complete heart block s/p permanent pacemaker, a flutter on Eliquis  is admitted with severe symptomatic anemia, 1 week history of dark stools, hemoglobin dropped from 9.8 to 4.2 in the span of 3 weeks  Anemia with dark stools concern for chronic blood loss in setting of aortic stenosis ?  AVMs Currently receiving blood transfusion Monitor CBC closely to maintain hemoglobin above 8 in setting of heart disease Eliquis  has been held since 9/10, last dose today morning Patient will need endoscopic evaluation and possible video capsule endoscopy based on EGD and colonoscopy findings Okay to resume diet, full liquids only  I have discussed alternative options, risks & benefits,  which include, but are not limited to, bleeding, infection, perforation,respiratory complication & drug reaction.  The patient agrees with this plan & written consent will be obtained.     Thank you for involving me in the care of this patient.      LOS: 0 days   Corinn Brooklyn, MD  12/23/2023, 3:09 PM    Note: This dictation was prepared with Dragon dictation along with smaller phrase technology. Any transcriptional errors that result from this process are unintentional.

## 2023-12-24 DIAGNOSIS — D62 Acute posthemorrhagic anemia: Secondary | ICD-10-CM | POA: Diagnosis not present

## 2023-12-24 DIAGNOSIS — I350 Nonrheumatic aortic (valve) stenosis: Secondary | ICD-10-CM | POA: Diagnosis not present

## 2023-12-24 DIAGNOSIS — I442 Atrioventricular block, complete: Secondary | ICD-10-CM | POA: Diagnosis not present

## 2023-12-24 DIAGNOSIS — E1165 Type 2 diabetes mellitus with hyperglycemia: Secondary | ICD-10-CM | POA: Insufficient documentation

## 2023-12-24 DIAGNOSIS — I10 Essential (primary) hypertension: Secondary | ICD-10-CM

## 2023-12-24 DIAGNOSIS — N400 Enlarged prostate without lower urinary tract symptoms: Secondary | ICD-10-CM | POA: Insufficient documentation

## 2023-12-24 DIAGNOSIS — I4892 Unspecified atrial flutter: Secondary | ICD-10-CM | POA: Diagnosis not present

## 2023-12-24 DIAGNOSIS — E785 Hyperlipidemia, unspecified: Secondary | ICD-10-CM

## 2023-12-24 LAB — CBC
HCT: 17.9 % — ABNORMAL LOW (ref 39.0–52.0)
Hemoglobin: 6 g/dL — ABNORMAL LOW (ref 13.0–17.0)
MCH: 30.3 pg (ref 26.0–34.0)
MCHC: 33.5 g/dL (ref 30.0–36.0)
MCV: 90.4 fL (ref 80.0–100.0)
Platelets: 118 K/uL — ABNORMAL LOW (ref 150–400)
RBC: 1.98 MIL/uL — ABNORMAL LOW (ref 4.22–5.81)
RDW: 17.7 % — ABNORMAL HIGH (ref 11.5–15.5)
WBC: 3.7 K/uL — ABNORMAL LOW (ref 4.0–10.5)
nRBC: 0.8 % — ABNORMAL HIGH (ref 0.0–0.2)

## 2023-12-24 LAB — GLUCOSE, CAPILLARY
Glucose-Capillary: 196 mg/dL — ABNORMAL HIGH (ref 70–99)
Glucose-Capillary: 290 mg/dL — ABNORMAL HIGH (ref 70–99)
Glucose-Capillary: 254 mg/dL — ABNORMAL HIGH (ref 70–99)
Glucose-Capillary: 209 mg/dL — ABNORMAL HIGH (ref 70–99)

## 2023-12-24 LAB — HEMOGLOBIN AND HEMATOCRIT, BLOOD
HCT: 22.1 % — ABNORMAL LOW (ref 39.0–52.0)
Hemoglobin: 7.5 g/dL — ABNORMAL LOW (ref 13.0–17.0)

## 2023-12-24 LAB — BASIC METABOLIC PANEL WITH GFR
Anion gap: 6 (ref 5–15)
BUN: 35 mg/dL — ABNORMAL HIGH (ref 8–23)
CO2: 24 mmol/L (ref 22–32)
Calcium: 8.3 mg/dL — ABNORMAL LOW (ref 8.9–10.3)
Chloride: 107 mmol/L (ref 98–111)
Creatinine, Ser: 1 mg/dL (ref 0.61–1.24)
GFR, Estimated: >60 mL/min (ref >60)
Glucose, Bld: 171 mg/dL — ABNORMAL HIGH (ref 70–99)
Potassium: 3.9 mmol/L (ref 3.5–5.1)
Sodium: 137 mmol/L (ref 135–145)

## 2023-12-24 LAB — VITAMIN B12: Vitamin B-12: 386 pg/mL (ref 180–914)

## 2023-12-24 LAB — PREPARE RBC (CROSSMATCH)

## 2023-12-24 MED ORDER — TAMSULOSIN HCL 0.4 MG PO CAPS
0.4000 mg | ORAL_CAPSULE | Freq: Every day | ORAL | Status: DC
Start: 2023-12-24 — End: 2023-12-29
  Administered 2023-12-24 – 2023-12-28 (×5): 0.4 mg via ORAL
  Filled 2023-12-24 (×5): qty 1

## 2023-12-24 MED ORDER — METOPROLOL SUCCINATE ER 25 MG PO TB24
25.0000 mg | ORAL_TABLET | Freq: Every day | ORAL | Status: DC
  Administered 2023-12-24 – 2023-12-29 (×6): 25 mg via ORAL
  Filled 2023-12-24 (×6): qty 1

## 2023-12-24 MED ORDER — IRON SUCROSE 300 MG IVPB - SIMPLE MED
300.0000 mg | Freq: Once | Status: AC
  Administered 2023-12-24: 300 mg via INTRAVENOUS
  Filled 2023-12-24 (×2): qty 265

## 2023-12-24 MED ORDER — SODIUM CHLORIDE 0.9% IV SOLUTION: Freq: Once | INTRAVENOUS | Status: AC

## 2023-12-24 MED ORDER — INSULIN GLARGINE 100 UNIT/ML ~~LOC~~ SOLN
8.0000 [IU] | Freq: Every day | SUBCUTANEOUS | Status: DC
  Administered 2023-12-24: 8 [IU] via SUBCUTANEOUS
  Filled 2023-12-24 (×2): qty 0.08

## 2023-12-24 MED ORDER — ACETAMINOPHEN 325 MG PO TABS
650.0000 mg | ORAL_TABLET | Freq: Once | ORAL | Status: AC
  Administered 2023-12-24: 650 mg via ORAL
  Filled 2023-12-24: qty 2

## 2023-12-24 NOTE — Assessment & Plan Note (Addendum)
 EKG shows paced rhythm 65 bpm.  Stroke risk higher off anticoagulation.  CHA2DS2-VASc score 5.  Continue Toprol .  Holding off on blood thinner with residual black bowel movement.

## 2023-12-24 NOTE — Assessment & Plan Note (Addendum)
 Sugars elevated.  Hemoglobin A1c 7.4.  Can go back on oral medications as outpatient

## 2023-12-24 NOTE — Assessment & Plan Note (Addendum)
 Case discussed with physician at [facility] and recommended nonemergent replacement of valve.  They recommended continuing to watch here at [facility] regional.  Case discussed with [facility] cardiology and they will set up for the CT of the aortic valve as outpatient.

## 2023-12-24 NOTE — Assessment & Plan Note (Addendum)
 Restart Toprol -XL

## 2023-12-24 NOTE — Progress Notes (Signed)
 GI Inpatient Follow-up Note  Subjective:  Patient seen and receiving another unit of blood. Hemodynamically stable. Notes dark stool intermittently but sometime he would not have a bowel movement for two days. Has history of some colon surgery due to cancer but can not locate records at this time.  Scheduled Inpatient Medications:   sodium chloride    Intravenous Once   sodium chloride    Intravenous Once   feeding supplement  237 mL Oral BID BM   gabapentin   1,200 mg Oral QHS   gabapentin   600 mg Oral q AM   insulin  aspart  0-5 Units Subcutaneous QHS   insulin  aspart  0-9 Units Subcutaneous TID WC   pantoprazole  (PROTONIX ) IV  40 mg Intravenous Q12H   pravastatin   40 mg Oral QHS    Continuous Inpatient Infusions:    PRN Inpatient Medications:  acetaminophen  **OR** acetaminophen , acetaminophen , ondansetron  **OR** ondansetron  (ZOFRAN ) IV  Review of Systems:  Review of Systems  Constitutional:  Positive for malaise/fatigue. Negative for chills and fever.  Respiratory:  Positive for shortness of breath.   Cardiovascular:  Negative for chest pain.  Gastrointestinal:  Positive for melena. Negative for abdominal pain, nausea and vomiting.  Musculoskeletal:  Positive for joint pain.  Skin:  Negative for rash.  Neurological:  Negative for focal weakness.  Psychiatric/Behavioral:  Negative for substance abuse.   All other systems reviewed and are negative.     Physical Examination: BP 108/62   Pulse 75   Temp 98.5 F (36.9 C) (Oral)   Resp 18   Ht 6' (1.829 m)   Wt 81.6 kg   SpO2 100%   BMI 24.41 kg/m  Gen: NAD, alert and oriented x 4 HEENT: PEERLA, EOMI, Neck: supple, no JVD or thyromegaly Chest: No respiratory distress Abd: soft, non-tender, non-distended Ext: no edema, well perfused with 2+ pulses, Skin: no rash or lesions noted Lymph: no LAD  Data: Lab Results  Component Value Date   WBC 3.7 (L) 12/24/2023   HGB 6.0 (L) 12/24/2023   HCT 17.9 (L) 12/24/2023    MCV 90.4 12/24/2023   PLT 118 (L) 12/24/2023   Recent Labs  Lab 12/23/23 1104 12/24/23 0255  HGB 4.2* 6.0*   Lab Results  Component Value Date   NA 137 12/24/2023   K 3.9 12/24/2023   CL 107 12/24/2023   CO2 24 12/24/2023   BUN 35 (H) 12/24/2023   CREATININE 1.00 12/24/2023   Lab Results  Component Value Date   ALT 8 12/23/2023   AST 12 (L) 12/23/2023   ALKPHOS 43 12/23/2023   BILITOT 0.5 12/23/2023   No results for input(s): APTT, INR, PTT in the last 168 hours. Assessment/Plan: Eduardo Ashley is a 89 y.o. gentleman with severe aortic stenosis and IDA/dark stool. Patient with history of symptomatic aortic stenosis. I have spoken with our anesthesiology team and they do not feel comfortable putting him to sleep for a EGD/Colonoscopy due to his severe aortic stenosis. Will attempt to transfer to a tertiary center for a higher level of care  Recommendations:  - transfuse to keep hemoglobin > 7 - bowel regimen - continue supportive care - transfer process started, awaiting further information  Frederic Schick MD, MPH [facility] GI

## 2023-12-24 NOTE — Assessment & Plan Note (Signed)
 On pravastatin.

## 2023-12-24 NOTE — Hospital Course (Addendum)
 89 y.o. 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      He is presenting today for evaluation of worsening weakness and black tarry stools for the past week or so.  Denies abdominal pain, nausea, vomiting, diarrhea, and syncope.  He has had persistent dizziness with standing which is not new. No history of GI bleeding   In the emergency department, his initial blood pressure was soft but fluid responsive.  Hemoglobin 4.2, down from 9.8 less than a month ago.  Hemoccult positive.  Received transfusion during my encounter. Will admit to hospitalist service with plans for upper and lower endoscopy with possible video capsule endoscopy Friday.   9/11.  Patient transfused 2 units of packed red blood cells and hemoglobin came up to 6.0.  Will give another unit of packed red blood cells today and IV iron  a little later on. 9/12.  Hemoglobin 7.8 this morning.  Has not had a bowel movement since coming in.  Patient feeling okay.  Will ambulate.   9/13.  Hemoglobin 7.5.  Will give another unit of blood and IV iron  today.  Had a black bowel movement in the afternoon. 9/14.  Hemoglobin 9.0.  Monitor for bowel movements. 9/15.  Hemoglobin 8.9.  Had another black bowel movement this morning.  Continue to monitor 9/16.  Patient had large dark brown bowel movement.  Hemoglobin 9.3.  Patient feels well.  Will discharge home.  Will hold off on blood thinners at this point.  Risk of stroke is higher being off blood thinner but patient did have a major bleed.

## 2023-12-24 NOTE — Assessment & Plan Note (Addendum)
 Hemoglobin 4.2 on admission.  Patient received 4 units of packed red blood cells and 2 IV iron  during the hospital course so far.  No procedure secondary to severe aortic stenosis.  Last hemoglobin 9.3.  Patient had a large dark brown bowel movement today.  Hesitant on starting blood thinner.  Will discharge home with close follow-up.  Will need repeat hemoglobin as outpatient appointment.

## 2023-12-24 NOTE — Assessment & Plan Note (Signed)
Patient has pace maker 

## 2023-12-24 NOTE — Assessment & Plan Note (Signed)
 On Flomax 

## 2023-12-24 NOTE — Progress Notes (Signed)
 Progress Note   Patient: Eduardo Ashley FMW:[id number] DOB: 12/17/1930 DOA: 12/23/2023     1 DOS: the patient was seen and examined on 12/24/2023   Brief hospital course: 89 y.o. 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      He is presenting today for evaluation of worsening weakness and black tarry stools for the past week or so.  Denies abdominal pain, nausea, vomiting, diarrhea, and syncope.  He has had persistent dizziness with standing which is not new. No history of GI bleeding   In the emergency department, his initial blood pressure was soft but fluid responsive.  Hemoglobin 4.2, down from 9.8 less than a month ago.  Hemoccult positive.  Received transfusion during my encounter. Will admit to hospitalist service with plans for upper and lower endoscopy with possible video capsule endoscopy Friday.   9/11.  Patient transfused 2 units of packed red blood cells and hemoglobin came up to 6.0.  Will give another unit of packed red blood cells today and IV iron  a little later on.  Assessment and Plan: * Acute blood loss anemia Hemoglobin 4.2 on admission.  Received 2 units and hemoglobin came up to 6.0.  Transfuse 1 more unit of packed red blood cells and give IV iron  a little bit later.  Case discussed with [facility] transfer center physician and recommended watching here.  Could be bleeding from AVM with history of severe aortic stenosis.  Continue IV Protonix .  Severe aortic stenosis Gastroenterology here recommended transfer to tertiary care center.  They would not rather not put him under sedation for an endoscopy.  Case discussed with physician at [facility] and recommended nonemergent replacement of valve.  They  recommended continuing to watch here at [facility] regional.  Atrial flutter (HCC) EKG shows paced rhythm 65 bpm.  Stroke risk higher off anticoagulation.  Complete heart block [facility]) Patient has pacemaker.  Essential hypertension Restart Toprol -XL  Hyperlipidemia, unspecified On pravastatin   BPH (benign prostatic hyperplasia) On Flomax   Uncontrolled type 2 diabetes mellitus with hyperglycemia, without long-term current use of insulin  (HCC) Sugars elevated.  Patient on sliding scale.  Will add insulin .  Holding oral medications.        Subjective: Patient feeling better than when he came in.  Received 2 units of packed red blood cells for hemoglobin of 4.2.  Admitted with acute blood loss anemia.  Physical Exam: Vitals:   12/24/23 1045 12/24/23 1123 12/24/23 1356 12/24/23 1430  BP: (!) 105/58 108/62 (!) 152/57 (!) 148/62  Pulse: 70 75  82  Resp: 20 18 16 18   Temp: 98.4 F (36.9 C) 98.5 F (36.9 C) 97.6 F (36.4 C) 98.1 F (36.7 C)  TempSrc: Oral Oral Oral Oral  SpO2: 98% 100% 98% 100%  Weight:      Height:       Physical Exam HENT:     Head: Normocephalic.  Eyes:     General: Lids are normal.     Comments: Conjunctiva pale  Cardiovascular:     Rate and Rhythm: Normal rate and regular rhythm.     Heart sounds: S1 normal and S2 normal. Murmur heard.     Systolic murmur is present with a grade of 3/6.  Pulmonary:     Breath sounds: No decreased breath  sounds, wheezing, rhonchi or rales.  Abdominal:     Palpations: Abdomen is soft.     Tenderness: There is no abdominal tenderness.  Musculoskeletal:     Right lower leg: No swelling.     Left lower leg: No swelling.  Skin:    General: Skin is warm.     Findings: No rash.  Neurological:     Mental Status: He is alert and oriented to person, place, and time.     Data Reviewed: Last hemoglobin 6, creatinine 1.0, ferritin 30  Family Communication: Spoke with brother at bedside  Disposition: Status is:  Inpatient Remains inpatient appropriate because: Receiving 3rd unit of blood today  Planned Discharge Destination: Home    Time spent: 28 minutes  Author: Charlie Patterson, MD 12/24/2023 3:13 PM  For on call review [email or web address].

## 2023-12-25 DIAGNOSIS — I350 Nonrheumatic aortic (valve) stenosis: Secondary | ICD-10-CM | POA: Diagnosis not present

## 2023-12-25 DIAGNOSIS — D62 Acute posthemorrhagic anemia: Secondary | ICD-10-CM | POA: Diagnosis not present

## 2023-12-25 DIAGNOSIS — I4892 Unspecified atrial flutter: Secondary | ICD-10-CM | POA: Diagnosis not present

## 2023-12-25 DIAGNOSIS — I442 Atrioventricular block, complete: Secondary | ICD-10-CM | POA: Diagnosis not present

## 2023-12-25 LAB — URINALYSIS, ROUTINE W REFLEX MICROSCOPIC
Bacteria, UA: NONE SEEN
Bilirubin Urine: NEGATIVE
Glucose, UA: >=500 mg/dL — AB
Hgb urine dipstick: NEGATIVE
Ketones, ur: NEGATIVE mg/dL
Leukocytes,Ua: NEGATIVE
Nitrite: NEGATIVE
Protein, ur: NEGATIVE mg/dL
Specific Gravity, Urine: 1.023 (ref 1.005–1.030)
Squamous Epithelial / HPF: 0 /HPF (ref 0–5)
pH: 5 (ref 5.0–8.0)

## 2023-12-25 LAB — CBC
HCT: 23.5 % — ABNORMAL LOW (ref 39.0–52.0)
Hemoglobin: 7.8 g/dL — ABNORMAL LOW (ref 13.0–17.0)
MCH: 30.4 pg (ref 26.0–34.0)
MCHC: 33.2 g/dL (ref 30.0–36.0)
MCV: 91.4 fL (ref 80.0–100.0)
Platelets: 132 K/uL — ABNORMAL LOW (ref 150–400)
RBC: 2.57 MIL/uL — ABNORMAL LOW (ref 4.22–5.81)
RDW: 17.2 % — ABNORMAL HIGH (ref 11.5–15.5)
WBC: 3.2 K/uL — ABNORMAL LOW (ref 4.0–10.5)
nRBC: 0 % (ref 0.0–0.2)

## 2023-12-25 LAB — GLUCOSE, CAPILLARY
Glucose-Capillary: 155 mg/dL — ABNORMAL HIGH (ref 70–99)
Glucose-Capillary: 285 mg/dL — ABNORMAL HIGH (ref 70–99)
Glucose-Capillary: 267 mg/dL — ABNORMAL HIGH (ref 70–99)
Glucose-Capillary: 150 mg/dL — ABNORMAL HIGH (ref 70–99)

## 2023-12-25 MED ORDER — INFLUENZA VAC SPLIT HIGH-DOSE 0.5 ML IM SUSY
0.5000 mL | PREFILLED_SYRINGE | INTRAMUSCULAR | Status: DC
  Filled 2023-12-25: qty 0.5

## 2023-12-25 MED ORDER — INSULIN GLARGINE 100 UNIT/ML ~~LOC~~ SOLN
12.0000 [IU] | Freq: Every day | SUBCUTANEOUS | Status: DC
  Administered 2023-12-25 – 2023-12-28 (×4): 12 [IU] via SUBCUTANEOUS
  Filled 2023-12-25 (×5): qty 0.12

## 2023-12-25 MED ORDER — POLYETHYLENE GLYCOL 3350 17 G PO PACK: 17.0000 g | PACK | Freq: Every day | ORAL | Status: DC | PRN

## 2023-12-25 NOTE — Consult Note (Addendum)
 [facility] CLINIC CARDIOLOGY CONSULT NOTE       Patient ID: AIZEN DUVAL MRN: [id number] DOB/AGE: 88-11-1930 88 y.o.  Admit date: 12/23/2023 Referring Physician Dr. Josette Primary Physician Gauger, Lauraine Collar, NP Primary Cardiologist Dr. Wilburn Reason for Consultation Severe AS  HPI: Eduardo Ashley is a 89 y.o. 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  Upon admission hemoglobin 4.2.  Patient with severe aortic stenosis and in process of workup with Dr. Katina as outpatient. Of note Dr. Katina sent orders to [facility] for urgent CT TAVR on 12/15/2023.  Cardiology was consulted for further evaluation patient severe aortic stenosis.  Work up in the ED notable for 134, potassium 4.5, creatinine 1.09, hemoglobin 4.2, platelets 131.  Iron  22.  Troponins elevated at 94 > 171. EKG with ventricular paced rhythm, rate 65 bpm.  CXR without congestion.  Patient has received 3x blood transfusions and given IV iron .   At the time of my evaluation this AM, patient was resting comfortably in hospital bed with family at bedside.  We discussed patient's symptoms in further detail.  Patient states for the past week he has had about 3X black tarry stools at home associated with decreased appetite and dizziness.  Patient states he was feeling dizzy at rest and had a syncopal episode when EMS arrived.  Patient denies any chest pain/pressure, SOB or palpitations.  Currently patient states he feels a lot better and denies any more dizziness or syncopal episodes.  Patient states he has not had any bowel movements since admission.  Pertinent Cardiac History (Most  recent) LHC (12/16/23)     Ost LAD to Prox LAD lesion is 10% stenosed.   Prox LAD lesion is 45% stenosed.   Ost Cx lesion is 55% stenosed.   Ost RPDA to RPDA lesion is 40% stenosed.   1.  Mild to moderate diffuse CAD 2.  Continue workup for aortic valve replacement  Review of systems complete and found to be negative unless listed above    Past Medical History:  Diagnosis Date   Aortic valve stenosis    Carotid artery stenosis    Coronary artery disease    Diabetes mellitus without complication (HCC)    GERD (gastroesophageal reflux disease)    Hypertension    Neuropathy    feet   Wears hearing aid in both ears     Past Surgical History:  Procedure Laterality Date   CARDIOVERSION N/A 11/26/2023   Procedure: CARDIOVERSION;  Surgeon: Hilarie Rocher, MD;  Location: [facility] ORS;  Service: Cardiovascular;  Laterality: N/A;   LEFT HEART CATH AND CORONARY ANGIOGRAPHY Left 06/25/2017   Procedure: LEFT HEART CATH AND CORONARY ANGIOGRAPHY;  Surgeon: Hester Wolm PARAS, MD;  Location: [facility] INVASIVE CV LAB;  Service: Cardiovascular;  Laterality: Left;   LEFT HEART CATH AND CORONARY ANGIOGRAPHY Left 12/16/2023   Procedure: LEFT HEART CATH AND CORONARY ANGIOGRAPHY;  Surgeon: Katina Albright, MD;  Location: [facility] INVASIVE CV LAB;  Service: Cardiovascular;  Laterality: Left;   PACEMAKER LEADLESS INSERTION N/A 11/27/2023   Procedure: PACEMAKER LEADLESS INSERTION;  Surgeon: Ammon Blunt, MD;  Location: [facility] INVASIVE CV LAB;  Service: Cardiovascular;  Laterality: N/A;    Medications Prior to Admission  Medication  Sig Dispense Refill Last Dose/Taking   acetaminophen  (TYLENOL ) 500 MG tablet Take 1,000 mg by mouth every 6 (six) hours as needed for moderate pain or headache.   Unknown   apixaban  (ELIQUIS ) 5 MG TABS tablet Take 5 mg by mouth 2 (two) times daily.   12/23/2023 at  8:00 AM   Ascorbic Acid (VITAMIN C PO) Take 1,000 mg by mouth daily. 500 mg   12/23/2023 Morning   aspirin  EC 81 MG tablet Take  81 mg by mouth daily.   12/23/2023 Morning   cholecalciferol (VITAMIN D3) 25 MCG (1000 UNIT) tablet Take 1,000 Units by mouth daily.   12/23/2023 Morning   finasteride (PROSCAR) 5 MG tablet Take 5 mg by mouth daily.   12/23/2023 Morning   gabapentin  (NEURONTIN ) 600 MG tablet Take 600-1,200 mg by mouth See admin instructions. Take 600 mg by mouth in the morning and take 1200 mg by mouth at bedtime   12/23/2023 Morning   glimepiride (AMARYL) 2 MG tablet Take 2-4 mg by mouth See admin instructions. Take 4 mg at breakfast and 2 mg at dinner   12/23/2023 Morning   irbesartan  (AVAPRO ) 300 MG tablet Take 300 mg by mouth daily.   12/23/2023 Morning   magnesium  oxide (MAG-OX) 400 MG tablet Take 400 mg by mouth daily.   12/23/2023 Morning   metFORMIN (GLUCOPHAGE) 1000 MG tablet Take 1,000 mg by mouth 2 (two) times daily with a meal.   12/23/2023 Morning   metoprolol  succinate (TOPROL -XL) 25 MG 24 hr tablet Take 25 mg by mouth daily.   12/23/2023 Morning   pravastatin  (PRAVACHOL ) 40 MG tablet Take 40 mg by mouth at bedtime.   12/22/2023 Evening   sitaGLIPtin (JANUVIA) 100 MG tablet Take 100 mg by mouth daily.   12/23/2023 Morning   tamsulosin  (FLOMAX ) 0.4 MG CAPS capsule Take 0.8 mg by mouth at bedtime.   12/22/2023 Evening   vitamin B-12 (CYANOCOBALAMIN) 500 MCG tablet Take 500 mcg by mouth daily.   12/23/2023 Morning   Social History   Socioeconomic History   Marital status: Widowed    Spouse name: Not on file   Number of children: Not on file   Years of education: Not on file   Highest education level: Not on file  Occupational History   Not on file  Tobacco Use   Smoking status: Former    Current packs/day: 0.00    Types: Cigarettes    Quit date: 90    Years since quitting: 56.7   Smokeless tobacco: Never  Vaping Use   Vaping status: Never Used  Substance and Sexual Activity   Alcohol use: No   Drug use: Never   Sexual activity: Not on file  Other Topics Concern   Not on file  Social History  Narrative   Not on file   Social Drivers of Health   Financial Resource Strain: Low Risk  (02/13/2023)   Received from [facility]   Overall Financial Resource Strain (CARDIA)    Difficulty of Paying Living Expenses: Not very hard  Food Insecurity: No Food Insecurity (12/23/2023)   Hunger Vital Sign    Worried About Running Out of Food in the Last Year: Never true    Ran Out of Food in the Last Year: Never true  Transportation Needs: No Transportation Needs (12/23/2023)   PRAPARE - Administrator, Civil Service (Medical): No    Lack of Transportation (Non-Medical): No  Physical Activity: Not on file  Stress:  Not on file  Social Connections: Socially Isolated (12/23/2023)   Social Connection and Isolation Panel    Frequency of Communication with Friends and Family: Once a week    Frequency of Social Gatherings with Friends and Family: Once a week    Attends Religious Services: More than 4 times per year    Active Member of [employer] or Organizations: No    Attends Banker Meetings: Never    Marital Status: Widowed  Intimate Partner Violence: Not At Risk (12/23/2023)   Humiliation, Afraid, Rape, and Kick questionnaire    Fear of Current or Ex-Partner: No    Emotionally Abused: No    Physically Abused: No    Sexually Abused: No    History reviewed. No pertinent family history.   Vitals:   12/24/23 2105 12/24/23 2319 12/25/23 0316 12/25/23 0926  BP: 110/60 116/63 136/61 106/72  Pulse: 61 61 (!) 59 (!) 59  Resp: 20 20 18 18   Temp: 98.1 F (36.7 C) 97.9 F (36.6 C) 97.9 F (36.6 C) 97.6 F (36.4 C)  TempSrc:      SpO2: 98% 95% 98% 90%  Weight:      Height:        PHYSICAL EXAM General: Well-appearing elderly male, well nourished, in no acute distress. HEENT: Normocephalic and atraumatic. Neck: No JVD.   Lungs: Normal respiratory effort on room air. Clear bilaterally to auscultation. No wheezes, crackles, rhonchi.  Heart: HRRR. Normal  S1 and S2, + systolic murmur.  Abdomen: Non-distended appearing.  Msk: Normal strength and tone for age. Extremities: Warm and well perfused. No clubbing, cyanosis, edema.  Neuro: Alert and oriented X 3. Psych: Answers questions appropriately.   Labs: Basic Metabolic Panel: Recent Labs    12/23/23 1104 12/24/23 0255  NA 134* 137  K 4.5 3.9  CL 104 107  CO2 25 24  GLUCOSE 320* 171*  BUN 38* 35*  CREATININE 1.06 1.00  CALCIUM 8.2* 8.3*   Liver Function Tests: Recent Labs    12/23/23 1104  AST 12*  ALT 8  ALKPHOS 43  BILITOT 0.5  PROT 4.2*  ALBUMIN 2.6*   No results for input(s): LIPASE, AMYLASE in the last 72 hours. CBC: Recent Labs    12/24/23 0255 12/24/23 1950 12/25/23 0803  WBC 3.7*  --  3.2*  HGB 6.0* 7.5* 7.8*  HCT 17.9* 22.1* 23.5*  MCV 90.4  --  91.4  PLT 118*  --  132*   Cardiac Enzymes: Recent Labs    12/23/23 1104 12/23/23 1325  TROPONINIHS 94* 171*   BNP: No results for input(s): BNP in the last 72 hours. D-Dimer: No results for input(s): DDIMER in the last 72 hours. Hemoglobin A1C: No results for input(s): HGBA1C in the last 72 hours. Fasting Lipid Panel: No results for input(s): CHOL, HDL, LDLCALC, TRIG, CHOLHDL, LDLDIRECT in the last 72 hours. Thyroid  Function Tests: No results for input(s): TSH, T4TOTAL, T3FREE, THYROIDAB in the last 72 hours.  Invalid input(s): FREET3 Anemia Panel: Recent Labs    12/23/23 1325 12/23/23 2125  VITAMINB12  --  386  FOLATE 11.2  --   FERRITIN 30  --   TIBC 298  --   IRON  22*  --      Radiology: DG Chest Portable 1 View Result Date: 12/23/2023 CLINICAL DATA:  Weakness EXAM: PORTABLE CHEST 1 VIEW COMPARISON:  11/30/2023 FINDINGS: The list pacer noted. Atherosclerotic calcification of the aortic arch. Mild enlargement of the cardiopericardial silhouette Mild scarring peripherally at  the left lung base. Mildly low lung volumes. Lower thoracic spondylosis. IMPRESSION:  1. Mild enlargement of the cardiopericardial silhouette, without edema. 2. Mild scarring peripherally at the left lung base. 3. Lower thoracic spondylosis. Electronically Signed   By: Ryan Salvage M.D.   On: 12/23/2023 13:05   CARDIAC CATHETERIZATION Result Date: 12/16/2023   Ost LAD to Prox LAD lesion is 10% stenosed.   Prox LAD lesion is 45% stenosed.   Ost Cx lesion is 55% stenosed.   Ost RPDA to RPDA lesion is 40% stenosed. 1.  Mild to moderate diffuse CAD 2.  Continue workup for aortic valve replacement   CT Cervical Spine Wo Contrast Result Date: 11/30/2023 CLINICAL DATA:  Clemens, near syncope, recent pacemaker placement EXAM: CT CERVICAL SPINE WITHOUT CONTRAST TECHNIQUE: Multidetector CT imaging of the cervical spine was performed without intravenous contrast. Multiplanar CT image reconstructions were also generated. RADIATION DOSE REDUCTION: This exam was performed according to the departmental dose-optimization program which includes automated exposure control, adjustment of the mA and/or kV according to patient size and/or use of iterative reconstruction technique. COMPARISON:  12/03/2022 FINDINGS: Alignment: Alignment is anatomic. Skull base and vertebrae: No acute fracture. No primary bone lesion or focal pathologic process. Soft tissues and spinal canal: No prevertebral fluid or swelling. No visible canal hematoma. Marked atherosclerosis at the carotid bifurcations. Disc levels: Disc spaces are well preserved. Mild facet hypertrophic changes on the left at C2-3 and C3-4. Upper chest: Airway is patent. Visualized portions of the lung apices are clear. Other: Reconstructed images demonstrate no additional findings. IMPRESSION: 1. No acute cervical spine fracture. Electronically Signed   By: Ozell Daring M.D.   On: 11/30/2023 17:19   CT Head Wo Contrast Result Date: 11/30/2023 CLINICAL DATA:  Pacemaker placement on Friday, near syncopal episode, orthostatic hypotension EXAM: CT HEAD WITHOUT  CONTRAST TECHNIQUE: Contiguous axial images were obtained from the base of the skull through the vertex without intravenous contrast. RADIATION DOSE REDUCTION: This exam was performed according to the departmental dose-optimization program which includes automated exposure control, adjustment of the mA and/or kV according to patient size and/or use of iterative reconstruction technique. COMPARISON:  None Available. FINDINGS: Brain: No acute infarct or hemorrhage. Focal hypodensity within the right basal ganglia consistent with chronic lacunar infarct. Lateral ventricles and remaining midline structures are unremarkable. No acute extra-axial fluid collections. No mass effect. Vascular: No hyperdense vessel or unexpected calcification. Skull: Normal. Negative for fracture or focal lesion. Sinuses/Orbits: No acute finding. Other: None. IMPRESSION: 1. No acute intracranial process. Electronically Signed   By: Ozell Daring M.D.   On: 11/30/2023 17:17   DG Chest 2 View Result Date: 11/30/2023 CLINICAL DATA:  Recent pacemaker.  Lightheadedness. EXAM: CHEST - 2 VIEW COMPARISON:  None Available. FINDINGS: The heart size and mediastinal contours are within normal limits. There is atelectasis in the left lung base. The lungs are otherwise clear. There is no pleural effusion or pneumothorax. The visualized skeletal structures are unremarkable. IMPRESSION: Left basilar atelectasis. Electronically Signed   By: Greig Pique M.D.   On: 11/30/2023 16:05   EP PPM/ICD IMPLANT Result Date: 11/27/2023 Successful [employer]onic Micra AV 2 leadless pacemaker implantation    ECHO 09/10/2023 NORMAL LEFT VENTRICULAR SYSTOLIC FUNCTION WITH MODERATE LVH  ESTIMATED EF: >55%  NORMAL LA PRESSURES WITH DIASTOLIC DYSFUNCTION (GRADE 1)  NORMAL RIGHT VENTRICULAR SYSTOLIC FUNCTION  VALVULAR REGURGITATION: No AR, MODERATE MR, No PR, MILD TR  ESTIMATED RVSP: 44 mmHg  VALVULAR STENOSIS: SEVERE AS, No MS, No  PS, No TS   TELEMETRY reviewed  by me 12/25/2023: Ventricular paced, rate 60s  EKG reviewed by me: Ventricular paced rhythm, rate 65 bpm  Data reviewed by me 12/25/2023: last 24h vitals tele labs imaging I/O ED provider note, admission H&P.  Principal Problem:   Acute blood loss anemia Active Problems:   Complete heart block (HCC)   Atrial flutter (HCC)   Essential hypertension   Hyperlipidemia, unspecified   Severe aortic stenosis   Coronary artery disease   Uncontrolled type 2 diabetes mellitus with hyperglycemia, without long-term current use of insulin  (HCC)   BPH (benign prostatic hyperplasia)    ASSESSMENT AND PLAN:  Eduardo Ashley is a 89 y.o. 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  Upon admission hemoglobin 4.2.  Patient with severe aortic stenosis and in process of workup with Dr. Katina as outpatient. Of note Dr. Katina sent orders to [facility] for urgent CT TAVR on 12/15/2023.  Cardiology was consulted for further evaluation patient severe aortic stenosis.  # Acute blood loss anemia Hgb 4.2 on admission, received 3X blood transfusions and IV iron . Hgb improving, 7.8 this AM. - Gastroenterology consulted. GI has no plan to perform endoscopy due to patients severe AS.  - Monitor H&H closely. - Recommend Hgb > 8 with cardiac history.   # Severe aortic stenosis Patient with recent LHC on 09/03 for aortic stenosis workup.  CT TAVR scheduled for today at [facility]. Discussed cased with Dr. Katina (09/12) and recommends to attempt to have CT TAVR imaging done as an inpatient at [facility]. If [facility] unable to perform, then stabilize Hgb and reschedule CT TAVR imaging as  outpatient next week. [facility] radiology reports unable to perform CT TAVR as an inpatient today or over weekend. Plan is if patient gets discharged over weekend will scheduled CT TAVR as outpatient next week at either [facility] or [facility]. Will ensure images get sent to [facility] for Dr. Katina to review.   # Paroxysmal atrial fibrillation/flutter # High-grade AV block s/p Micra placed pacemaker (11/2023) - Home Eliquis  held due to severe acute anemia requiring blood transfusions.  Recommend resuming when hemoglobin stabilizes, no evidence of active bleeding and when GI clears patient to resume. -Continue metoprolol  succinate 25 mg daily.  # Coronary artery disease # Hypertension # Hyperlipidemia # Demand ischemia, setting of acute severe anemia Patient without chest pain, SOB. Recent. LHC on 12/16/2023 with Mild to moderate diffuse CAD.  -Home aspirin  held due to severe anemia requiring multiple blood transfusions and IV iron . -Continue home pravastatin  40 mg daily. -Continue home metoprolol  succinate as stated above. -Home irbesartan  300 mg daily held due to borderline.  This patient's plan of care was discussed and created with Dr. Ammon and he is in agreement.  Signed: Dorene Comfort, PA-C  12/25/2023, 10:37 AM [facility] Cardiology

## 2023-12-25 NOTE — Progress Notes (Signed)
 Progress Note   Patient: Eduardo Ashley FMW:[id number] DOB: December 01, 1930 DOA: 12/23/2023     2 DOS: the patient was seen and examined on 12/25/2023   Brief hospital course: 89 y.o. 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      He is presenting today for evaluation of worsening weakness and black tarry stools for the past week or so.  Denies abdominal pain, nausea, vomiting, diarrhea, and syncope.  He has had persistent dizziness with standing which is not new. No history of GI bleeding   In the emergency department, his initial blood pressure was soft but fluid responsive.  Hemoglobin 4.2, down from 9.8 less than a month ago.  Hemoccult positive.  Received transfusion during my encounter. Will admit to hospitalist service with plans for upper and lower endoscopy with possible video capsule endoscopy Friday.   9/11.  Patient transfused 2 units of packed red blood cells and hemoglobin came up to 6.0.  Will give another unit of packed red blood cells today and IV iron  a little later on. 9/12.  Hemoglobin 7.8 this morning.  Has not had a bowel movement since coming in.  Patient feeling okay.  Will ambulate.  Assessment and Plan: * Acute blood loss anemia Hemoglobin 4.2 on admission.  Patient received 3 units of packed red blood cells and IV iron  during the hospital course so far.  Last hemoglobin 7.8.  Patient has not had a bowel movement since coming into the hospital.  On IV Protonix .  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  Severe aortic stenosis Case discussed with physician at [facility] and recommended nonemergent replacement of valve.  They recommended continuing to watch here at [facility].  Case  discussed with [facility] clinic cardiology and they will set up for the CT of the aortic valve as outpatient.  Atrial flutter (HCC) EKG shows paced rhythm 65 bpm.  Stroke risk higher off anticoagulation.  CHA2DS2-VASc score 5.  Complete heart block [facility]) Patient has pacemaker.  Essential hypertension Continue Toprol -XL  Hyperlipidemia, unspecified On pravastatin   BPH (benign prostatic hyperplasia) On Flomax   Uncontrolled type 2 diabetes mellitus with hyperglycemia, without long-term current use of insulin  (HCC) Sugars elevated.  Patient on sliding scale.  Continue long-acting insulin  increased to 12 units.  Holding oral medications.        Subjective: Patient has not had a bowel movement here in the hospital.  Feels okay.  Hemoglobin 7.8.  Came in with black stools for a week.  Physical Exam: Vitals:   12/24/23 2319 12/25/23 0316 12/25/23 0926 12/25/23 1238  BP: 116/63 136/61 106/72 (!) 120/49  Pulse: 61 (!) 59 (!) 59 (!) 59  Resp: 20 18 18 18   Temp: 97.9 F (36.6 C) 97.9 F (36.6 C) 97.6 F (36.4 C) 97.9 F (36.6 C)  TempSrc:      SpO2: 95% 98% 90% 96%  Weight:      Height:       Physical Exam HENT:     Head: Normocephalic.  Eyes:     General: Lids are normal.  Cardiovascular:     Rate and Rhythm: Normal rate and regular rhythm.     Heart sounds: S1 normal and S2 normal. Murmur heard.     Systolic murmur is present with a grade  of 3/6.  Pulmonary:     Breath sounds: No decreased breath sounds, wheezing, rhonchi or rales.  Abdominal:     Palpations: Abdomen is soft.     Tenderness: There is no abdominal tenderness.  Musculoskeletal:     Right lower leg: No swelling.     Left lower leg: No swelling.  Skin:    General: Skin is warm.     Findings: No rash.  Neurological:     Mental Status: He is alert and oriented to person, place, and time.     Data Reviewed: Platelets count 3.2, hemoglobin 7.8, platelet count 132  Family Communication: Spoke with  son at the bedside  Disposition: Status is: Inpatient Remains inpatient appropriate because: Patient came in with very low hemoglobin.  Waiting to see next bowel movement and if his hemoglobin holds.  Also need to decide what to do with his Eliquis .  Planned Discharge Destination: Home    Time spent: 28 minutes  Author: Charlie Patterson, MD 12/25/2023 12:56 PM  For on call review [email or web address].

## 2023-12-25 NOTE — Plan of Care (Signed)

## 2023-12-25 NOTE — Plan of Care (Signed)
  Problem: Clinical Measurements: Goal: Ability to maintain clinical measurements within normal limits will improve 12/25/2023 1719 by Val Michaelle SAILOR, RN Outcome: Progressing 12/25/2023 1005 by Val Michaelle SAILOR, RN Outcome: Progressing Goal: Will remain free from infection 12/25/2023 1719 by Val Michaelle SAILOR, RN Outcome: Progressing 12/25/2023 1005 by Val Michaelle SAILOR, RN Outcome: Progressing Goal: Diagnostic test results will improve 12/25/2023 1719 by Val Michaelle SAILOR, RN Outcome: Progressing 12/25/2023 1005 by Val Michaelle SAILOR, RN Outcome: Progressing Goal: Respiratory complications will improve 12/25/2023 1719 by Val Michaelle SAILOR, RN Outcome: Progressing 12/25/2023 1005 by Val Michaelle SAILOR, RN Outcome: Progressing Goal: Cardiovascular complication will be avoided 12/25/2023 1719 by Val Michaelle SAILOR, RN Outcome: Progressing 12/25/2023 1005 by Val Michaelle SAILOR, RN Outcome: Progressing

## 2023-12-25 NOTE — Progress Notes (Signed)
 Eduardo Ashley , MD [address], [place], [place], [address] Phone: [phone] Fax: [phone]   Eduardo Ashley is being followed for GI bleed    Subjective: Has not had a bowel movement today . No pain , denies any bleeding    Objective: Vital signs in last 24 hours: Vitals:   12/24/23 1430 12/24/23 2105 12/24/23 2319 12/25/23 0316  BP: (!) 148/62 110/60 116/63 136/61  Pulse: 82 61 61 (!) 59  Resp: 18 20 20 18   Temp: 98.1 F (36.7 C) 98.1 F (36.7 C) 97.9 F (36.6 C) 97.9 F (36.6 C)  TempSrc: Oral     SpO2: 100% 98% 95% 98%  Weight:      Height:       Weight change:   Intake/Output Summary (Last 24 hours) at 12/25/2023 9157 Last data filed at 12/25/2023 0600 Gross per 24 hour  Intake 896.5 ml  Output 1250 ml  Net -353.5 ml     Exam: Alert and orientated x3- not in any pain or distress   Lab Results: @LABTEST2 @ Micro Results: No results found for this or any previous visit (from the past 240 hours). Studies/Results: DG Chest Portable 1 View Result Date: 12/23/2023 CLINICAL DATA:  Weakness EXAM: PORTABLE CHEST 1 VIEW COMPARISON:  11/30/2023 FINDINGS: The list pacer noted. Atherosclerotic calcification of the aortic arch. Mild enlargement of the cardiopericardial silhouette Mild scarring peripherally at the left lung base. Mildly low lung volumes. Lower thoracic spondylosis. IMPRESSION: 1. Mild enlargement of the cardiopericardial silhouette, without edema. 2. Mild scarring peripherally at the left lung base. 3. Lower thoracic spondylosis. Electronically Signed   By: Ryan Salvage M.D.   On: 12/23/2023 13:05   Medications: I have reviewed the patient's current medications. Scheduled Meds:  sodium chloride    Intravenous Once   feeding supplement  237 mL Oral BID BM   gabapentin   1,200 mg Oral QHS   gabapentin   600 mg Oral q AM   insulin  aspart  0-5 Units Subcutaneous QHS   insulin  aspart  0-9 Units Subcutaneous TID WC   insulin  glargine   8 Units Subcutaneous QHS   metoprolol  succinate  25 mg Oral Daily   pantoprazole  (PROTONIX ) IV  40 mg Intravenous Q12H   pravastatin   40 mg Oral QHS   tamsulosin   0.4 mg Oral QPC supper   Continuous Infusions: PRN Meds:.acetaminophen  **OR** acetaminophen , acetaminophen , ondansetron  **OR** ondansetron  (ZOFRAN ) IV     Latest Ref Rng & Units 12/25/2023    8:03 AM 12/24/2023    7:50 PM 12/24/2023    2:55 AM  CBC  WBC 4.0 - 10.5 K/uL 3.2   3.7   Hemoglobin 13.0 - 17.0 g/dL 7.8  7.5  6.0   Hematocrit 39.0 - 52.0 % 23.5  22.1  17.9   Platelets 150 - 400 K/uL 132   118     Assessment: Principal Problem:   Acute blood loss anemia Active Problems:   Complete heart block (HCC)   Atrial flutter (HCC)   Essential hypertension   Hyperlipidemia, unspecified   Severe aortic stenosis   Coronary artery disease   Uncontrolled type 2 diabetes mellitus with hyperglycemia, without long-term current use of insulin  (HCC)   BPH (benign prostatic hyperplasia)  Eduardo Ashley is a 89 y.o. gentleman with severe aortic stenosis and IDA/dark stool. Patient with history of symptomatic aortic stenosis. Dr Maryruth has spoken with our anesthesiology team yesterday and they do not feel comfortable putting him to sleep for  a EGD/Colonoscopy due to his severe aortic stenosis. It was recommended to  transfer to a tertiary center for a higher level of care. Hb stable . If has acute severe bleed consider CT angiogram +/- embolization If appropriate. 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 Discussed with Dr Josette - re restarting blood thinners- difficult decision as have to balance risk of rebleeding vs risk of stroke with both being equally high. If decision is made to restart probably best to closely monitor for 48 hours in the hospital and as an outpatient with cards/PCP ,  ensure on PPI. He requested some miralax  as he has not had a bowel movement in a few days. I will place the order.   I will sign off.  Please call me if any further GI concerns or questions.  We would like to thank you for the opportunity to participate in the care of Eduardo Ashley.     LOS: 2 days   Eduardo Kung, MD 12/25/2023, 8:42 AM

## 2023-12-25 NOTE — Plan of Care (Signed)

## 2023-12-25 NOTE — Evaluation (Signed)
 Physical Therapy Evaluation Patient Details Name: Eduardo Ashley MRN: [id number] DOB: 1931-02-18 Today's Date: 12/25/2023  History of Present Illness  Eduardo Ashley is a 89 y.o. male pressents to ED for evaluation of worsening weakness and black tarry stools for the past week. In the emergency department, his initial blood pressure was soft but fluid responsive.  Hemoglobin 4.2.  Clinical Impression  Pt is a pleasant 89 year old male who was admitted for worsening weakness. Prior to hospitalization, pt ambulates with RW and is independent with ADLs as he lives alone. Pt performs bed mobility with independence, not needing physical assistance or use of bed features. Pt requires min A for STS with lift from lowest bed height and is CGA with ambulation. Pt had tendency to walk with RW far from him but is able to correct with verbal cuing. Pt demonstrates poor eccentric control when sitting. Pt demonstrates deficits with strength and balance. Would benefit from skilled PT to address above deficits and promote optimal return to PLOF.          If plan is discharge home, recommend the following: A little help with walking and/or transfers;A little help with bathing/dressing/bathroom;Help with stairs or ramp for entrance   Can travel by private vehicle        Equipment Recommendations None recommended by PT  Recommendations for Other Services       Functional Status Assessment Patient has had a recent decline in their functional status and demonstrates the ability to make significant improvements in function in a reasonable and predictable amount of time.     Precautions / Restrictions Precautions Precautions: Fall Recall of Precautions/Restrictions: Intact Restrictions Weight Bearing Restrictions Per Provider Order: No      Mobility  Bed Mobility Overal bed mobility: Independent             General bed mobility comments: Able to perform bed mobility independently     Transfers Overall transfer level: Needs assistance Equipment used: Rolling walker (2 wheels) Transfers: Sit to/from Stand Sit to Stand: Min assist           General transfer comment: Able to stand from EOB at lowest bed height with min A for lift from bed. Reports of dizziness once sitting at EOB.    Ambulation/Gait Ambulation/Gait assistance: Modified independent (Device/Increase time) Gait Distance (Feet): 200 Feet Assistive device: Rolling walker (2 wheels) Gait Pattern/deviations: WFL(Within Functional Limits) Gait velocity: decreased     General Gait Details: Verbal cues to keep walker closer.  Stairs            Wheelchair Mobility     Tilt Bed    Modified Rankin (Stroke Patients Only)       Balance Overall balance assessment: Needs assistance Sitting-balance support: No upper extremity supported, Feet supported Sitting balance-Leahy Scale: Normal Sitting balance - Comments: able to maintain seated balance at EOB. Steady reaching outside of BOS for gown adjustment and for SPT to apply gait belt   Standing balance support: Bilateral upper extremity supported, During functional activity, Reliant on assistive device for balance Standing balance-Leahy Scale: Fair Standing balance comment: Able to maintain standing balance without LOB. kypthotic posture throughout standing and ambulation.                             Pertinent Vitals/Pain Pain Assessment Pain Assessment: No/denies pain    Home Living Family/patient expects to be discharged to:: Private residence Living Arrangements: Alone  Available Help at Discharge: Family Type of Home: House Home Access: Stairs to enter Entrance Stairs-Rails: Right Entrance Stairs-Number of Steps: 2   Home Layout: One level Home Equipment: Agricultural consultant (2 wheels);Cane - single point;Shower seat;Grab bars - toilet;Grab bars - tub/shower      Prior Function Prior Level of Function :  Independent/Modified Independent             Mobility Comments: Used RW for ambulation. 0 falls reported in the past 6 months ADLs Comments: independent     Extremity/Trunk Assessment   Upper Extremity Assessment Upper Extremity Assessment: Overall WFL for tasks assessed    Lower Extremity Assessment Lower Extremity Assessment: Overall WFL for tasks assessed    Cervical / Trunk Assessment Cervical / Trunk Assessment: Kyphotic  Communication   Communication Communication: Impaired Factors Affecting Communication: Hearing impaired (Has hearing aids in B ears)    Cognition Arousal: Alert Behavior During Therapy: WFL for tasks assessed/performed   PT - Cognitive impairments: No apparent impairments                       PT - Cognition Comments: Pt is A&Ox4. Pleasant and agreeable to PT session Following commands: Intact       Cueing Cueing Techniques: Verbal cues     General Comments      Exercises     Assessment/Plan    PT Assessment Patient needs continued PT services  PT Problem List Decreased strength;Decreased activity tolerance;Decreased mobility;Decreased balance       PT Treatment Interventions DME instruction;Gait training;Stair training;Functional mobility training;Therapeutic activities;Therapeutic exercise;Balance training;Neuromuscular re-education;Patient/family education;Manual techniques    PT Goals (Current goals can be found in the Care Plan section)  Acute Rehab PT Goals Patient Stated Goal: to go home PT Goal Formulation: With patient Time For Goal Achievement: 01/08/24 Potential to Achieve Goals: Good    Frequency Min 1X/week     Co-evaluation               AM-PAC PT 6 Clicks Mobility  Outcome Measure Help needed turning from your back to your side while in a flat bed without using bedrails?: None Help needed moving from lying on your back to sitting on the side of a flat bed without using bedrails?: None Help  needed moving to and from a bed to a chair (including a wheelchair)?: A Little   Help needed to walk in hospital room?: A Little Help needed climbing 3-5 steps with a railing? : A Little 6 Click Score: 17    End of Session Equipment Utilized During Treatment: Gait belt Activity Tolerance: Patient tolerated treatment well Patient left: in bed;with call bell/phone within reach;with bed alarm set;with family/visitor present Nurse Communication: Mobility status PT Visit Diagnosis: Unsteadiness on feet (R26.81)    Time: 1325-1340 PT Time Calculation (min) (ACUTE ONLY): 15 min   Charges:                 Hope Brandenburger, SPT   Tejon Gracie 12/25/2023, 2:17 PM

## 2023-12-26 DIAGNOSIS — D62 Acute posthemorrhagic anemia: Secondary | ICD-10-CM | POA: Diagnosis not present

## 2023-12-26 DIAGNOSIS — I442 Atrioventricular block, complete: Secondary | ICD-10-CM | POA: Diagnosis not present

## 2023-12-26 DIAGNOSIS — I350 Nonrheumatic aortic (valve) stenosis: Secondary | ICD-10-CM | POA: Diagnosis not present

## 2023-12-26 DIAGNOSIS — I4892 Unspecified atrial flutter: Secondary | ICD-10-CM | POA: Diagnosis not present

## 2023-12-26 LAB — GLUCOSE, CAPILLARY
Glucose-Capillary: 189 mg/dL — ABNORMAL HIGH (ref 70–99)
Glucose-Capillary: 272 mg/dL — ABNORMAL HIGH (ref 70–99)
Glucose-Capillary: 267 mg/dL — ABNORMAL HIGH (ref 70–99)
Glucose-Capillary: 196 mg/dL — ABNORMAL HIGH (ref 70–99)

## 2023-12-26 LAB — CBC
HCT: 23.1 % — ABNORMAL LOW (ref 39.0–52.0)
Hemoglobin: 7.5 g/dL — ABNORMAL LOW (ref 13.0–17.0)
MCH: 29.9 pg (ref 26.0–34.0)
MCHC: 32.5 g/dL (ref 30.0–36.0)
MCV: 92 fL (ref 80.0–100.0)
Platelets: 131 K/uL — ABNORMAL LOW (ref 150–400)
RBC: 2.51 MIL/uL — ABNORMAL LOW (ref 4.22–5.81)
RDW: 17.1 % — ABNORMAL HIGH (ref 11.5–15.5)
WBC: 3.5 K/uL — ABNORMAL LOW (ref 4.0–10.5)
nRBC: 0 % (ref 0.0–0.2)

## 2023-12-26 LAB — PREPARE RBC (CROSSMATCH)

## 2023-12-26 MED ORDER — IRON SUCROSE 200 MG IVPB - SIMPLE MED
200.0000 mg | Freq: Once | Status: AC
Start: 2023-12-26 — End: 2023-12-26
  Administered 2023-12-26: 200 mg via INTRAVENOUS
  Filled 2023-12-26: qty 200

## 2023-12-26 MED ORDER — POLYETHYLENE GLYCOL 3350 17 G PO PACK
17.0000 g | PACK | Freq: Every day | ORAL | Status: DC
  Administered 2023-12-26: 17 g via ORAL
  Filled 2023-12-26: qty 1

## 2023-12-26 MED ORDER — SODIUM CHLORIDE 0.9% IV SOLUTION: Freq: Once | INTRAVENOUS | Status: AC

## 2023-12-26 MED ORDER — POLYETHYLENE GLYCOL 3350 17 G PO PACK
17.0000 g | PACK | Freq: Two times a day (BID) | ORAL | Status: DC
  Administered 2023-12-26 – 2023-12-28 (×5): 17 g via ORAL
  Filled 2023-12-26 (×5): qty 1

## 2023-12-26 NOTE — Progress Notes (Signed)
 Progress Note   Patient: Eduardo Ashley FMW:[id number] DOB: September 16, 1930 DOA: 12/23/2023     3 DOS: the patient was seen and examined on 12/26/2023   Brief hospital course: 89 y.o. 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      He is presenting today for evaluation of worsening weakness and black tarry stools for the past week or so.  Denies abdominal pain, nausea, vomiting, diarrhea, and syncope.  He has had persistent dizziness with standing which is not new. No history of GI bleeding   In the emergency department, his initial blood pressure was soft but fluid responsive.  Hemoglobin 4.2, down from 9.8 less than a month ago.  Hemoccult positive.  Received transfusion during my encounter. Will admit to hospitalist service with plans for upper and lower endoscopy with possible video capsule endoscopy Friday.   9/11.  Patient transfused 2 units of packed red blood cells and hemoglobin came up to 6.0.  Will give another unit of packed red blood cells today and IV iron  a little later on. 9/12.  Hemoglobin 7.8 this morning.  Has not had a bowel movement since coming in.  Patient feeling okay.  Will ambulate.  Assessment and Plan: * Acute blood loss anemia Hemoglobin 4.2 on admission.  Patient received 3 units of packed red blood cells and IV iron  during the hospital course so far.  Last hemoglobin 7.5.  Patient has not had a bowel movement since coming into the hospital.  On IV Protonix .  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  Will give another unit of blood today and another dose of IV iron  later on.  Severe aortic stenosis Case discussed with physician at [facility] and recommended nonemergent replacement of valve.   They recommended continuing to watch here at [facility].  Case discussed with [facility] clinic cardiology and they will set up for the CT of the aortic valve (likely as outpatient)  Atrial flutter (HCC) EKG shows paced rhythm 65 bpm.  Stroke risk higher off anticoagulation.  CHA2DS2-VASc score 5.  Complete heart block [facility]) Patient has pacemaker.  Essential hypertension Continue Toprol -XL  Hyperlipidemia, unspecified On pravastatin   BPH (benign prostatic hyperplasia) On Flomax   Uncontrolled type 2 diabetes mellitus with hyperglycemia, without long-term current use of insulin  (HCC) Sugars elevated.  Patient on sliding scale.  Continue long-acting insulin  increased to 12 units.  Holding oral medications.  Last hemoglobin A1c 7.4        Subjective: Patient feels okay.  Still has not had a bowel movement since coming in.  Admitted with very low hemoglobin of 4.2 and black stools for a week prior to coming in.  Physical Exam: Vitals:   12/26/23 0856 12/26/23 0858 12/26/23 0900 12/26/23 1145  BP: (!) 110/50 (!) 105/54 (!) 128/58 (!) 149/55  Pulse:  (!) 59  62  Resp:  16  16  Temp:  (!) 97.5 F (36.4 C)  98.1 F (36.7 C)  TempSrc:      SpO2:  90%  97%  Weight:      Height:       Physical Exam HENT:     Head: Normocephalic.  Eyes:     General: Lids are normal.  Cardiovascular:     Rate and Rhythm: Normal rate and regular rhythm.  Heart sounds: S1 normal and S2 normal. Murmur heard.     Systolic murmur is present with a grade of 3/6.  Pulmonary:     Breath sounds: No decreased breath sounds, wheezing, rhonchi or rales.  Abdominal:     Palpations: Abdomen is soft.     Tenderness: There is no abdominal tenderness.  Musculoskeletal:     Right lower leg: No swelling.     Left lower leg: No swelling.  Skin:    General: Skin is warm.     Findings: No rash.  Neurological:     Mental Status: He is alert and oriented to person, place, and time.     Data  Reviewed: White blood cell count 3.5, hemoglobin 7.5, platelet count 131  Family Communication: Spoke with son on the phone  Disposition: Status is: Inpatient Remains inpatient appropriate because: Will give a unit of blood today and IV iron  later.  Patient leaning towards restarting blood thinner even though there is high risk of bleeding.  Patient states that he does not want to have a stroke.  Will see what the next bowel movement looks like.  Planned Discharge Destination: Home    Time spent: 28 minutes  Author: Charlie Patterson, MD 12/26/2023 12:44 PM  For on call review [email or web address].

## 2023-12-26 NOTE — Plan of Care (Signed)

## 2023-12-26 NOTE — Progress Notes (Signed)
 [facility] CLINIC CARDIOLOGY PROGRESS NOTE       Patient ID: Eduardo Ashley MRN: [id number] DOB/AGE: 12-16-1930 89 y.o.  Admit date: 12/23/2023 Referring Physician Eduardo Ashley Primary Physician Gauger, Lauraine Collar, NP Primary Cardiologist Dr. Wilburn Reason for Consultation Severe AS  HPI: Eduardo Ashley is a 89 y.o. 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  Upon admission hemoglobin 4.2.  Patient with severe aortic stenosis and in process of workup with Dr. Katina as outpatient. Of note Dr. Katina sent orders to [facility] for urgent CT TAVR on 12/15/2023.  Cardiology was consulted for further evaluation patient severe aortic stenosis.  Interval history: -Patient seen and examined this AM, resting in hospital bed. Denies CP, SOB, overall feeling better. -Receiving blood transfusion this AM, Hgb was 7.5 on AM labs. Also will get Iron  infusion. -Plan to proceed with outpatient CT TAVR once discharged.   Pertinent Cardiac History (Most recent) LHC (12/16/23)     Ost LAD to Prox LAD lesion is 10% stenosed.   Prox LAD lesion is 45% stenosed.   Ost Cx lesion is 55% stenosed.   Ost RPDA to RPDA lesion is 40% stenosed.   1.  Mild to moderate diffuse CAD 2.  Continue workup for aortic valve replacement  Review of systems complete and found to be negative unless listed above    Past Medical History:  Diagnosis Date   Aortic valve stenosis    Carotid artery stenosis    Coronary artery disease    Diabetes mellitus without complication (HCC)    GERD (gastroesophageal reflux disease)    Hypertension    Neuropathy    feet   Wears  hearing aid in both ears     Past Surgical History:  Procedure Laterality Date   CARDIOVERSION N/A 11/26/2023   Procedure: CARDIOVERSION;  Surgeon: Eduardo Rocher, MD;  Location: [facility] ORS;  Service: Cardiovascular;  Laterality: N/A;   LEFT HEART CATH AND CORONARY ANGIOGRAPHY Left 06/25/2017   Procedure: LEFT HEART CATH AND CORONARY ANGIOGRAPHY;  Surgeon: Eduardo Wolm PARAS, MD;  Location: [facility] INVASIVE CV LAB;  Service: Cardiovascular;  Laterality: Left;   LEFT HEART CATH AND CORONARY ANGIOGRAPHY Left 12/16/2023   Procedure: LEFT HEART CATH AND CORONARY ANGIOGRAPHY;  Surgeon: Eduardo Albright, MD;  Location: [facility] INVASIVE CV LAB;  Service: Cardiovascular;  Laterality: Left;   PACEMAKER LEADLESS INSERTION N/A 11/27/2023   Procedure: PACEMAKER LEADLESS INSERTION;  Surgeon: Eduardo Blunt, MD;  Location: [facility] INVASIVE CV LAB;  Service: Cardiovascular;  Laterality: N/A;    Medications Prior to Admission  Medication Sig Dispense Refill Last Dose/Taking   acetaminophen  (TYLENOL ) 500 MG tablet Take 1,000 mg by mouth every 6 (six) hours as needed for moderate pain or headache.   Unknown   apixaban  (ELIQUIS ) 5 MG TABS tablet Take 5 mg by mouth 2 (two) times daily.   12/23/2023 at  8:00 AM   Ascorbic Acid (VITAMIN C PO) Take 1,000 mg by mouth daily. 500 mg   12/23/2023 Morning   aspirin  EC 81 MG tablet Take 81 mg by mouth daily.   12/23/2023 Morning   cholecalciferol (VITAMIN D3) 25 MCG (1000 UNIT) tablet Take 1,000 Units by mouth daily.   12/23/2023 Morning  finasteride (PROSCAR) 5 MG tablet Take 5 mg by mouth daily.   12/23/2023 Morning   gabapentin  (NEURONTIN ) 600 MG tablet Take 600-1,200 mg by mouth See admin instructions. Take 600 mg by mouth in the morning and take 1200 mg by mouth at bedtime   12/23/2023 Morning   glimepiride (AMARYL) 2 MG tablet Take 2-4 mg by mouth See admin instructions. Take 4 mg at breakfast and 2 mg at dinner   12/23/2023 Morning   irbesartan  (AVAPRO ) 300 MG tablet Take 300 mg by  mouth daily.   12/23/2023 Morning   magnesium  oxide (MAG-OX) 400 MG tablet Take 400 mg by mouth daily.   12/23/2023 Morning   metFORMIN (GLUCOPHAGE) 1000 MG tablet Take 1,000 mg by mouth 2 (two) times daily with a meal.   12/23/2023 Morning   metoprolol  succinate (TOPROL -XL) 25 MG 24 hr tablet Take 25 mg by mouth daily.   12/23/2023 Morning   pravastatin  (PRAVACHOL ) 40 MG tablet Take 40 mg by mouth at bedtime.   12/22/2023 Evening   sitaGLIPtin (JANUVIA) 100 MG tablet Take 100 mg by mouth daily.   12/23/2023 Morning   tamsulosin  (FLOMAX ) 0.4 MG CAPS capsule Take 0.8 mg by mouth at bedtime.   12/22/2023 Evening   vitamin B-12 (CYANOCOBALAMIN) 500 MCG tablet Take 500 mcg by mouth daily.   12/23/2023 Morning   Social History   Socioeconomic History   Marital status: Widowed    Spouse name: Not on file   Number of children: Not on file   Years of education: Not on file   Highest education level: Not on file  Occupational History   Not on file  Tobacco Use   Smoking status: Former    Current packs/day: 0.00    Types: Cigarettes    Quit date: 75    Years since quitting: 56.7   Smokeless tobacco: Never  Vaping Use   Vaping status: Never Used  Substance and Sexual Activity   Alcohol use: No   Drug use: Never   Sexual activity: Not on file  Other Topics Concern   Not on file  Social History Narrative   Not on file   Social Drivers of Health   Financial Resource Strain: Low Risk  (02/13/2023)   Received from [facility]   Overall Financial Resource Strain (CARDIA)    Difficulty of Paying Living Expenses: Not very hard  Food Insecurity: No Food Insecurity (12/23/2023)   Hunger Vital Sign    Worried About Running Out of Food in the Last Year: Never true    Ran Out of Food in the Last Year: Never true  Transportation Needs: No Transportation Needs (12/23/2023)   PRAPARE - Administrator, Civil Service (Medical): No    Lack of Transportation (Non-Medical): No   Physical Activity: Not on file  Stress: Not on file  Social Connections: Socially Isolated (12/23/2023)   Social Connection and Isolation Panel    Frequency of Communication with Friends and Family: Once a week    Frequency of Social Gatherings with Friends and Family: Once a week    Attends Religious Services: More than 4 times per year    Active Member of [employer] or Organizations: No    Attends Banker Meetings: Never    Marital Status: Widowed  Intimate Partner Violence: Not At Risk (12/23/2023)   Humiliation, Afraid, Rape, and Kick questionnaire    Fear of Current or Ex-Partner: No    Emotionally Abused: No  Physically Abused: No    Sexually Abused: No    History reviewed. No pertinent family history.   Vitals:   12/25/23 2040 12/25/23 2353 12/26/23 0335 12/26/23 0822  BP: 129/61 105/65 124/61 (!) 138/51  Pulse: 66 62 63 62  Resp: 17 17 17 16   Temp: 98.7 F (37.1 C) 98.3 F (36.8 C) 97.8 F (36.6 C) 97.7 F (36.5 C)  TempSrc:      SpO2: 99% 95% 94% 96%  Weight:      Height:        PHYSICAL EXAM General: Well-appearing elderly male, well nourished, in no acute distress. HEENT: Normocephalic and atraumatic. Neck: No JVD.   Lungs: Normal respiratory effort on room air. Clear bilaterally to auscultation. No wheezes, crackles, rhonchi.  Heart: HRRR. Normal S1 and S2, + systolic murmur.  Abdomen: Non-distended appearing.  Msk: Normal strength and tone for age. Extremities: Warm and well perfused. No clubbing, cyanosis, edema.  Neuro: Alert and oriented X 3. Psych: Answers questions appropriately.   Labs: Basic Metabolic Panel: Recent Labs    12/23/23 1104 12/24/23 0255  NA 134* 137  K 4.5 3.9  CL 104 107  CO2 25 24  GLUCOSE 320* 171*  BUN 38* 35*  CREATININE 1.06 1.00  CALCIUM 8.2* 8.3*   Liver Function Tests: Recent Labs    12/23/23 1104  AST 12*  ALT 8  ALKPHOS 43  BILITOT 0.5  PROT 4.2*  ALBUMIN 2.6*   No results for input(s):  LIPASE, AMYLASE in the last 72 hours. CBC: Recent Labs    12/25/23 0803 12/26/23 0431  WBC 3.2* 3.5*  HGB 7.8* 7.5*  HCT 23.5* 23.1*  MCV 91.4 92.0  PLT 132* 131*   Cardiac Enzymes: Recent Labs    12/23/23 1104 12/23/23 1325  TROPONINIHS 94* 171*   BNP: No results for input(s): BNP in the last 72 hours. D-Dimer: No results for input(s): DDIMER in the last 72 hours. Hemoglobin A1C: No results for input(s): HGBA1C in the last 72 hours. Fasting Lipid Panel: No results for input(s): CHOL, HDL, LDLCALC, TRIG, CHOLHDL, LDLDIRECT in the last 72 hours. Thyroid  Function Tests: No results for input(s): TSH, T4TOTAL, T3FREE, THYROIDAB in the last 72 hours.  Invalid input(s): FREET3 Anemia Panel: Recent Labs    12/23/23 1325 12/23/23 2125  VITAMINB12  --  386  FOLATE 11.2  --   FERRITIN 30  --   TIBC 298  --   IRON  22*  --      Radiology: DG Chest Portable 1 View Result Date: 12/23/2023 CLINICAL DATA:  Weakness EXAM: PORTABLE CHEST 1 VIEW COMPARISON:  11/30/2023 FINDINGS: The list pacer noted. Atherosclerotic calcification of the aortic arch. Mild enlargement of the cardiopericardial silhouette Mild scarring peripherally at the left lung base. Mildly low lung volumes. Lower thoracic spondylosis. IMPRESSION: 1. Mild enlargement of the cardiopericardial silhouette, without edema. 2. Mild scarring peripherally at the left lung base. 3. Lower thoracic spondylosis. Electronically Signed   By: Eduardo Ashley M.D.   On: 12/23/2023 13:05   CARDIAC CATHETERIZATION Result Date: 12/16/2023   Ost LAD to Prox LAD lesion is 10% stenosed.   Prox LAD lesion is 45% stenosed.   Ost Cx lesion is 55% stenosed.   Ost RPDA to RPDA lesion is 40% stenosed. 1.  Mild to moderate diffuse CAD 2.  Continue workup for aortic valve replacement   CT Cervical Spine Wo Contrast Result Date: 11/30/2023 CLINICAL DATA:  Eduardo Ashley, near syncope, recent pacemaker placement EXAM: CT  CERVICAL SPINE WITHOUT CONTRAST TECHNIQUE: Multidetector CT imaging of the cervical spine was performed without intravenous contrast. Multiplanar CT image reconstructions were also generated. RADIATION DOSE REDUCTION: This exam was performed according to the departmental dose-optimization program which includes automated exposure control, adjustment of the mA and/or kV according to patient size and/or use of iterative reconstruction technique. COMPARISON:  12/03/2022 FINDINGS: Alignment: Alignment is anatomic. Skull base and vertebrae: No acute fracture. No primary bone lesion or focal pathologic process. Soft tissues and spinal canal: No prevertebral fluid or swelling. No visible canal hematoma. Marked atherosclerosis at the carotid bifurcations. Disc levels: Disc spaces are well preserved. Mild facet hypertrophic changes on the left at C2-3 and C3-4. Upper chest: Airway is patent. Visualized portions of the lung apices are clear. Other: Reconstructed images demonstrate no additional findings. IMPRESSION: 1. No acute cervical spine fracture. Electronically Signed   By: Eduardo Ashley M.D.   On: 11/30/2023 17:19   CT Head Wo Contrast Result Date: 11/30/2023 CLINICAL DATA:  Pacemaker placement on Friday, near syncopal episode, orthostatic hypotension EXAM: CT HEAD WITHOUT CONTRAST TECHNIQUE: Contiguous axial images were obtained from the base of the skull through the vertex without intravenous contrast. RADIATION DOSE REDUCTION: This exam was performed according to the departmental dose-optimization program which includes automated exposure control, adjustment of the mA and/or kV according to patient size and/or use of iterative reconstruction technique. COMPARISON:  None Available. FINDINGS: Brain: No acute infarct or hemorrhage. Focal hypodensity within the right basal ganglia consistent with chronic lacunar infarct. Lateral ventricles and remaining midline structures are unremarkable. No acute extra-axial fluid  collections. No mass effect. Vascular: No hyperdense vessel or unexpected calcification. Skull: Normal. Negative for fracture or focal lesion. Sinuses/Orbits: No acute finding. Other: None. IMPRESSION: 1. No acute intracranial process. Electronically Signed   By: Eduardo Ashley M.D.   On: 11/30/2023 17:17   DG Chest 2 View Result Date: 11/30/2023 CLINICAL DATA:  Recent pacemaker.  Lightheadedness. EXAM: CHEST - 2 VIEW COMPARISON:  None Available. FINDINGS: The heart size and mediastinal contours are within normal limits. There is atelectasis in the left lung base. The lungs are otherwise clear. There is no pleural effusion or pneumothorax. The visualized skeletal structures are unremarkable. IMPRESSION: Left basilar atelectasis. Electronically Signed   By: Eduardo Ashley M.D.   On: 11/30/2023 16:05   EP PPM/ICD IMPLANT Result Date: 11/27/2023 Successful [employer] Micra AV 2 leadless pacemaker implantation    ECHO 09/10/2023 NORMAL LEFT VENTRICULAR SYSTOLIC FUNCTION WITH MODERATE LVH  ESTIMATED EF: >55%  NORMAL LA PRESSURES WITH DIASTOLIC DYSFUNCTION (GRADE 1)  NORMAL RIGHT VENTRICULAR SYSTOLIC FUNCTION  VALVULAR REGURGITATION: No AR, MODERATE MR, No PR, MILD TR  ESTIMATED RVSP: 44 mmHg  VALVULAR STENOSIS: SEVERE AS, No MS, No PS, No TS   TELEMETRY reviewed by me 12/26/2023: Ventricular paced, rate 60s  EKG reviewed by me: Ventricular paced rhythm, rate 65 bpm  Data reviewed by me 12/26/2023: last 24h vitals tele labs imaging I/O ED provider note, admission H&P, hospitalist progress notes, GI ntoes  Principal Problem:   Acute blood loss anemia Active Problems:   Complete heart block (HCC)   Atrial flutter (HCC)   Essential hypertension   Hyperlipidemia, unspecified   Severe aortic stenosis   Coronary artery disease   Uncontrolled type 2 diabetes mellitus with hyperglycemia, without long-term current use of insulin  (HCC)   BPH (benign prostatic hyperplasia)    ASSESSMENT AND PLAN:   KASSON LAMERE is a 89 y.o. male  with a  past medical history of mild to moderate diffuse coronary artery disease (LHC 12/16/2023), hypertension, hyperlipidemia, carotid artery disease high-grade AV block s/p Micra leadless pacemaker (11/2023), paroxysmal atrial fibrillation/flutter (on Eliquis ), severe aortic stenosis who presented to the ED on 12/23/2023 for worsening generalized weakness and black tarry stools for the past week.  Upon admission hemoglobin 4.2.  Patient with severe aortic stenosis and in process of workup with Dr. Katina as outpatient. Of note Dr. Katina sent orders to [facility] for urgent CT TAVR on 12/15/2023.  Cardiology was consulted for further evaluation patient severe aortic stenosis.  # Acute blood loss anemia Hgb 4.2 on admission, received 3X blood transfusions and IV iron . Hgb improving, 7.8 this AM. - Gastroenterology consulted, no plan to perform endoscopy due to patients severe AS.  - Monitor H&H closely. - Recommend Hgb > 8 with cardiac history.   # Severe aortic stenosis Patient with recent LHC on 09/03 for aortic stenosis workup.  CT TAVR scheduled for 9/12 at [facility] Discussed case with Dr. Katina (09/12) and recommends to attempt to have CT TAVR imaging done as an inpatient at [facility]. If [facility] unable to perform, then stabilize Hgb and reschedule CT TAVR imaging as outpatient next week. [facility] radiology reports unable to perform CT TAVR as an inpatient today or over weekend. Plan is if patient gets discharged over weekend will scheduled CT TAVR as outpatient next week at either [facility] or [facility]. Will ensure images get sent to [facility] for Dr. Katina to review if scan done at [facility].   # Paroxysmal atrial fibrillation/flutter # High-grade AV block s/p Micra placed pacemaker (11/2023) - Home Eliquis  held due to severe acute anemia requiring blood transfusions.  Recommend resuming when hemoglobin stabilizes, no evidence of active bleeding and when GI clears patient to resume. -Continue  metoprolol  succinate 25 mg daily.  # Coronary artery disease # Hypertension # Hyperlipidemia # Demand ischemia, setting of acute severe anemia Patient without chest pain, SOB. Recent LHC 12/16/2023 with mild to moderate diffuse CAD.  -Home aspirin  held due to severe anemia requiring multiple blood transfusions and IV iron . -Continue home pravastatin  40 mg daily. -Continue home metoprolol  succinate as stated above. -Home irbesartan  300 mg daily held due to borderline BP. Consider resuming if this improves and remains stable.  This patient's plan of care was discussed and created with Dr. Florencio and he is in agreement.  Signed: Danita Bloch, PA-C  12/26/2023, 8:40 AM [facility] Cardiology

## 2023-12-27 DIAGNOSIS — D62 Acute posthemorrhagic anemia: Secondary | ICD-10-CM | POA: Diagnosis not present

## 2023-12-27 DIAGNOSIS — I442 Atrioventricular block, complete: Secondary | ICD-10-CM | POA: Diagnosis not present

## 2023-12-27 DIAGNOSIS — I4892 Unspecified atrial flutter: Secondary | ICD-10-CM | POA: Diagnosis not present

## 2023-12-27 DIAGNOSIS — I350 Nonrheumatic aortic (valve) stenosis: Secondary | ICD-10-CM | POA: Diagnosis not present

## 2023-12-27 LAB — BPAM RBC
Blood Product Expiration Date: 202509222359
Blood Product Expiration Date: 202510092359
Blood Product Expiration Date: 202509212359
Blood Product Expiration Date: 202510122359
ISSUE DATE / TIME: 202509130826
ISSUE DATE / TIME: 202509111100
ISSUE DATE / TIME: 202509101259
ISSUE DATE / TIME: 202509101620
Unit Type and Rh: 600
Unit Type and Rh: 6200
Unit Type and Rh: 600
Unit Type and Rh: 6200

## 2023-12-27 LAB — TYPE AND SCREEN
ABO/RH(D): A POS
Antibody Screen: NEGATIVE
Unit division: 0
Unit division: 0
Unit division: 0
Unit division: 0

## 2023-12-27 LAB — GLUCOSE, CAPILLARY
Glucose-Capillary: 149 mg/dL — ABNORMAL HIGH (ref 70–99)
Glucose-Capillary: 162 mg/dL — ABNORMAL HIGH (ref 70–99)
Glucose-Capillary: 322 mg/dL — ABNORMAL HIGH (ref 70–99)
Glucose-Capillary: 297 mg/dL — ABNORMAL HIGH (ref 70–99)

## 2023-12-27 LAB — HEMOGLOBIN: Hemoglobin: 9 g/dL — ABNORMAL LOW (ref 13.0–17.0)

## 2023-12-27 MED ORDER — GLUCERNA SHAKE PO LIQD
237.0000 mL | Freq: Three times a day (TID) | ORAL | Status: DC
  Administered 2023-12-27 – 2023-12-29 (×5): 237 mL via ORAL

## 2023-12-27 NOTE — Progress Notes (Signed)
 [facility] CLINIC CARDIOLOGY PROGRESS NOTE       Patient ID: Eduardo Ashley MRN: [id number] DOB/AGE: 08/10/1930 89 y.o.  Admit date: 12/23/2023 Referring Physician Dr. Josette Primary Physician Gauger, Lauraine Collar, NP Primary Cardiologist Dr. Wilburn Reason for Consultation Severe AS  HPI: Eduardo Ashley is a 89 y.o. 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  Upon admission hemoglobin 4.2.  Patient with severe aortic stenosis and in process of workup with Dr. Katina as outpatient. Of note Dr. Katina sent orders to [facility] for urgent CT TAVR on 12/15/2023.  Cardiology was consulted for further evaluation patient severe aortic stenosis.  Interval history: -Patient seen and examined this AM, resting in hospital bed. Denies CP, SOB, overall feeling better. -Hgb 9.0 this AM after transfusions yesterday. Had dark BM yesterday suggesting old GIB.  -Plan to proceed with outpatient CT TAVR once discharged.   Pertinent Cardiac History (Most recent) LHC (12/16/23)     Ost LAD to Prox LAD lesion is 10% stenosed.   Prox LAD lesion is 45% stenosed.   Ost Cx lesion is 55% stenosed.   Ost RPDA to RPDA lesion is 40% stenosed.   1.  Mild to moderate diffuse CAD 2.  Continue workup for aortic valve replacement  Review of systems complete and found to be negative unless listed above    Past Medical History:  Diagnosis Date   Aortic valve stenosis    Carotid artery stenosis    Coronary artery disease    Diabetes mellitus without complication (HCC)    GERD (gastroesophageal reflux disease)    Hypertension    Neuropathy    feet   Wears  hearing aid in both ears     Past Surgical History:  Procedure Laterality Date   CARDIOVERSION N/A 11/26/2023   Procedure: CARDIOVERSION;  Surgeon: Hilarie Rocher, MD;  Location: [facility] ORS;  Service: Cardiovascular;  Laterality: N/A;   LEFT HEART CATH AND CORONARY ANGIOGRAPHY Left 06/25/2017   Procedure: LEFT HEART CATH AND CORONARY ANGIOGRAPHY;  Surgeon: Hester Wolm PARAS, MD;  Location: [facility] INVASIVE CV LAB;  Service: Cardiovascular;  Laterality: Left;   LEFT HEART CATH AND CORONARY ANGIOGRAPHY Left 12/16/2023   Procedure: LEFT HEART CATH AND CORONARY ANGIOGRAPHY;  Surgeon: Katina Albright, MD;  Location: [facility] INVASIVE CV LAB;  Service: Cardiovascular;  Laterality: Left;   PACEMAKER LEADLESS INSERTION N/A 11/27/2023   Procedure: PACEMAKER LEADLESS INSERTION;  Surgeon: Ammon Blunt, MD;  Location: [facility] INVASIVE CV LAB;  Service: Cardiovascular;  Laterality: N/A;    Medications Prior to Admission  Medication Sig Dispense Refill Last Dose/Taking   acetaminophen  (TYLENOL ) 500 MG tablet Take 1,000 mg by mouth every 6 (six) hours as needed for moderate pain or headache.   Unknown   apixaban  (ELIQUIS ) 5 MG TABS tablet Take 5 mg by mouth 2 (two) times daily.   12/23/2023 at  8:00 AM   Ascorbic Acid (VITAMIN C PO) Take 1,000 mg by mouth daily. 500 mg   12/23/2023 Morning   aspirin  EC 81 MG tablet Take 81 mg by mouth daily.   12/23/2023 Morning   cholecalciferol (VITAMIN D3) 25 MCG (1000 UNIT) tablet Take 1,000 Units by mouth daily.   12/23/2023 Morning  finasteride (PROSCAR) 5 MG tablet Take 5 mg by mouth daily.   12/23/2023 Morning   gabapentin  (NEURONTIN ) 600 MG tablet Take 600-1,200 mg by mouth See admin instructions. Take 600 mg by mouth in the morning and take 1200 mg by mouth at bedtime   12/23/2023 Morning   glimepiride (AMARYL) 2 MG tablet Take 2-4 mg by mouth See admin instructions. Take 4 mg at breakfast and 2 mg at dinner   12/23/2023 Morning   irbesartan  (AVAPRO ) 300 MG tablet Take 300 mg by  mouth daily.   12/23/2023 Morning   magnesium  oxide (MAG-OX) 400 MG tablet Take 400 mg by mouth daily.   12/23/2023 Morning   metFORMIN (GLUCOPHAGE) 1000 MG tablet Take 1,000 mg by mouth 2 (two) times daily with a meal.   12/23/2023 Morning   metoprolol  succinate (TOPROL -XL) 25 MG 24 hr tablet Take 25 mg by mouth daily.   12/23/2023 Morning   pravastatin  (PRAVACHOL ) 40 MG tablet Take 40 mg by mouth at bedtime.   12/22/2023 Evening   sitaGLIPtin (JANUVIA) 100 MG tablet Take 100 mg by mouth daily.   12/23/2023 Morning   tamsulosin  (FLOMAX ) 0.4 MG CAPS capsule Take 0.8 mg by mouth at bedtime.   12/22/2023 Evening   vitamin B-12 (CYANOCOBALAMIN) 500 MCG tablet Take 500 mcg by mouth daily.   12/23/2023 Morning   Social History   Socioeconomic History   Marital status: Widowed    Spouse name: Not on file   Number of children: Not on file   Years of education: Not on file   Highest education level: Not on file  Occupational History   Not on file  Tobacco Use   Smoking status: Former    Current packs/day: 0.00    Types: Cigarettes    Quit date: 70    Years since quitting: 56.7   Smokeless tobacco: Never  Vaping Use   Vaping status: Never Used  Substance and Sexual Activity   Alcohol use: No   Drug use: Never   Sexual activity: Not on file  Other Topics Concern   Not on file  Social History Narrative   Not on file   Social Drivers of Health   Financial Resource Strain: Low Risk  (02/13/2023)   Received from [facility]   Overall Financial Resource Strain (CARDIA)    Difficulty of Paying Living Expenses: Not very hard  Food Insecurity: No Food Insecurity (12/23/2023)   Hunger Vital Sign    Worried About Running Out of Food in the Last Year: Never true    Ran Out of Food in the Last Year: Never true  Transportation Needs: No Transportation Needs (12/23/2023)   PRAPARE - Administrator, Civil Service (Medical): No    Lack of Transportation (Non-Medical): No   Physical Activity: Not on file  Stress: Not on file  Social Connections: Socially Isolated (12/23/2023)   Social Connection and Isolation Panel    Frequency of Communication with Friends and Family: Once a week    Frequency of Social Gatherings with Friends and Family: Once a week    Attends Religious Services: More than 4 times per year    Active Member of [employer] or Organizations: No    Attends Banker Meetings: Never    Marital Status: Widowed  Intimate Partner Violence: Not At Risk (12/23/2023)   Humiliation, Afraid, Rape, and Kick questionnaire    Fear of Current or Ex-Partner: No    Emotionally Abused: No  Physically Abused: No    Sexually Abused: No    History reviewed. No pertinent family history.   Vitals:   12/26/23 2358 12/26/23 2358 12/27/23 0422 12/27/23 0756  BP: 123/77 123/77 133/62 (!) 111/55  Pulse: 65 64 (!) 59 (!) 59  Resp: 17 17 16 18   Temp: 97.9 F (36.6 C) 97.9 F (36.6 C) 97.8 F (36.6 C) 97.7 F (36.5 C)  TempSrc:    Oral  SpO2: 96% 96% 93% 98%  Weight:      Height:        PHYSICAL EXAM General: Well-appearing elderly male, well nourished, in no acute distress. HEENT: Normocephalic and atraumatic. Neck: No JVD.   Lungs: Normal respiratory effort on room air. Clear bilaterally to auscultation. No wheezes, crackles, rhonchi.  Heart: HRRR. Normal S1 and S2, + systolic murmur.  Abdomen: Non-distended appearing.  Msk: Normal strength and tone for age. Extremities: Warm and well perfused. No clubbing, cyanosis, edema.  Neuro: Alert and oriented X 3. Psych: Answers questions appropriately.   Labs: Basic Metabolic Panel: No results for input(s): NA, K, CL, CO2, GLUCOSE, BUN, CREATININE, CALCIUM, MG, PHOS in the last 72 hours.  Liver Function Tests: No results for input(s): AST, ALT, ALKPHOS, BILITOT, PROT, ALBUMIN in the last 72 hours.  No results for input(s): LIPASE, AMYLASE in the last 72  hours. CBC: Recent Labs    12/25/23 0803 12/26/23 0431 12/27/23 0621  WBC 3.2* 3.5*  --   HGB 7.8* 7.5* 9.0*  HCT 23.5* 23.1*  --   MCV 91.4 92.0  --   PLT 132* 131*  --    Cardiac Enzymes: No results for input(s): CKTOTAL, CKMB, CKMBINDEX, TROPONINIHS in the last 72 hours.  BNP: No results for input(s): BNP in the last 72 hours. D-Dimer: No results for input(s): DDIMER in the last 72 hours. Hemoglobin A1C: No results for input(s): HGBA1C in the last 72 hours. Fasting Lipid Panel: No results for input(s): CHOL, HDL, LDLCALC, TRIG, CHOLHDL, LDLDIRECT in the last 72 hours. Thyroid  Function Tests: No results for input(s): TSH, T4TOTAL, T3FREE, THYROIDAB in the last 72 hours.  Invalid input(s): FREET3 Anemia Panel: No results for input(s): VITAMINB12, FOLATE, FERRITIN, TIBC, IRON , RETICCTPCT in the last 72 hours.    Radiology: DG Chest Portable 1 View Result Date: 12/23/2023 CLINICAL DATA:  Weakness EXAM: PORTABLE CHEST 1 VIEW COMPARISON:  11/30/2023 FINDINGS: The list pacer noted. Atherosclerotic calcification of the aortic arch. Mild enlargement of the cardiopericardial silhouette Mild scarring peripherally at the left lung base. Mildly low lung volumes. Lower thoracic spondylosis. IMPRESSION: 1. Mild enlargement of the cardiopericardial silhouette, without edema. 2. Mild scarring peripherally at the left lung base. 3. Lower thoracic spondylosis. Electronically Signed   By: Ryan Salvage M.D.   On: 12/23/2023 13:05   CARDIAC CATHETERIZATION Result Date: 12/16/2023   Ost LAD to Prox LAD lesion is 10% stenosed.   Prox LAD lesion is 45% stenosed.   Ost Cx lesion is 55% stenosed.   Ost RPDA to RPDA lesion is 40% stenosed. 1.  Mild to moderate diffuse CAD 2.  Continue workup for aortic valve replacement   CT Cervical Spine Wo Contrast Result Date: 11/30/2023 CLINICAL DATA:  Clemens, near syncope, recent pacemaker placement EXAM: CT  CERVICAL SPINE WITHOUT CONTRAST TECHNIQUE: Multidetector CT imaging of the cervical spine was performed without intravenous contrast. Multiplanar CT image reconstructions were also generated. RADIATION DOSE REDUCTION: This exam was performed according to the departmental dose-optimization program which includes automated exposure control,  adjustment of the mA and/or kV according to patient size and/or use of iterative reconstruction technique. COMPARISON:  12/03/2022 FINDINGS: Alignment: Alignment is anatomic. Skull base and vertebrae: No acute fracture. No primary bone lesion or focal pathologic process. Soft tissues and spinal canal: No prevertebral fluid or swelling. No visible canal hematoma. Marked atherosclerosis at the carotid bifurcations. Disc levels: Disc spaces are well preserved. Mild facet hypertrophic changes on the left at C2-3 and C3-4. Upper chest: Airway is patent. Visualized portions of the lung apices are clear. Other: Reconstructed images demonstrate no additional findings. IMPRESSION: 1. No acute cervical spine fracture. Electronically Signed   By: Ozell Daring M.D.   On: 11/30/2023 17:19   CT Head Wo Contrast Result Date: 11/30/2023 CLINICAL DATA:  Pacemaker placement on Friday, near syncopal episode, orthostatic hypotension EXAM: CT HEAD WITHOUT CONTRAST TECHNIQUE: Contiguous axial images were obtained from the base of the skull through the vertex without intravenous contrast. RADIATION DOSE REDUCTION: This exam was performed according to the departmental dose-optimization program which includes automated exposure control, adjustment of the mA and/or kV according to patient size and/or use of iterative reconstruction technique. COMPARISON:  None Available. FINDINGS: Brain: No acute infarct or hemorrhage. Focal hypodensity within the right basal ganglia consistent with chronic lacunar infarct. Lateral ventricles and remaining midline structures are unremarkable. No acute extra-axial fluid  collections. No mass effect. Vascular: No hyperdense vessel or unexpected calcification. Skull: Normal. Negative for fracture or focal lesion. Sinuses/Orbits: No acute finding. Other: None. IMPRESSION: 1. No acute intracranial process. Electronically Signed   By: Ozell Daring M.D.   On: 11/30/2023 17:17   DG Chest 2 View Result Date: 11/30/2023 CLINICAL DATA:  Recent pacemaker.  Lightheadedness. EXAM: CHEST - 2 VIEW COMPARISON:  None Available. FINDINGS: The heart size and mediastinal contours are within normal limits. There is atelectasis in the left lung base. The lungs are otherwise clear. There is no pleural effusion or pneumothorax. The visualized skeletal structures are unremarkable. IMPRESSION: Left basilar atelectasis. Electronically Signed   By: Greig Pique M.D.   On: 11/30/2023 16:05   EP PPM/ICD IMPLANT Result Date: 11/27/2023 Successful Med[employer]onic Micra AV 2 leadless pacemaker implantation    ECHO 09/10/2023 NORMAL LEFT VENTRICULAR SYSTOLIC FUNCTION WITH MODERATE LVH  ESTIMATED EF: >55%  NORMAL LA PRESSURES WITH DIASTOLIC DYSFUNCTION (GRADE 1)  NORMAL RIGHT VENTRICULAR SYSTOLIC FUNCTION  VALVULAR REGURGITATION: No AR, MODERATE MR, No PR, MILD TR  ESTIMATED RVSP: 44 mmHg  VALVULAR STENOSIS: SEVERE AS, No MS, No PS, No TS   TELEMETRY reviewed by me 12/27/2023: Ventricular paced, rate 60s  EKG reviewed by me: Ventricular paced rhythm, rate 65 bpm  Data reviewed by me 12/27/2023: last 24h vitals tele labs imaging I/O ED provider note, admission H&P, hospitalist progress notes, GI ntoes  Principal Problem:   Acute blood loss anemia Active Problems:   Complete heart block (HCC)   Atrial flutter (HCC)   Essential hypertension   Hyperlipidemia, unspecified   Severe aortic stenosis   Coronary artery disease   Uncontrolled type 2 diabetes mellitus with hyperglycemia, without long-term current use of insulin  (HCC)   BPH (benign prostatic hyperplasia)    ASSESSMENT AND PLAN:   Eduardo Ashley is a 89 y.o. male  with a past medical history of mild to moderate diffuse coronary artery disease (LHC 12/16/2023), hypertension, hyperlipidemia, carotid artery disease high-grade AV block s/p Micra leadless pacemaker (11/2023), paroxysmal atrial fibrillation/flutter (on Eliquis ), severe aortic stenosis who presented to the ED on 12/23/2023 for  worsening generalized weakness and black tarry stools for the past week.  Upon admission hemoglobin 4.2.  Patient with severe aortic stenosis and in process of workup with Dr. Katina as outpatient. Of note Dr. Katina sent orders to [facility] for urgent CT TAVR on 12/15/2023.  Cardiology was consulted for further evaluation patient severe aortic stenosis.  # Acute blood loss anemia Hgb 4.2 on admission, received 3X blood transfusions and IV iron . Hgb improving, 9.0 this AM. Did have dark BM 12/26/23.  - Gastroenterology consulted, no plan to perform endoscopy due to patients severe AS.  - Monitor H&H closely. - Recommend Hgb > 8 with cardiac history.   # Severe aortic stenosis Patient with recent LHC on 09/03 for aortic stenosis workup.  CT TAVR scheduled for 9/12 at [facility]. Discussed case with Dr. Katina (09/12) and recommends to attempt to have CT TAVR imaging done as an inpatient at [facility]. If [facility] unable to perform, then stabilize Hgb and reschedule CT TAVR imaging as outpatient next week. [facility] radiology reports unable to perform CT TAVR as an inpatient today or over weekend. Plan is if patient gets discharged over weekend will scheduled CT TAVR as outpatient next week at either [facility] or [facility]. Will ensure images get sent to [facility] for Dr. Katina to review if scan done at [facility].   # Paroxysmal atrial fibrillation/flutter # High-grade AV block s/p Micra placed pacemaker (11/2023) - Home Eliquis  held due to severe acute anemia requiring blood transfusions.  Recommend resuming when hemoglobin stabilizes, no evidence of active bleeding and when GI clears patient  to resume. Can consider holding on DC and following up labs outpatient and resuming eliquis  in 1-2 weeks. -Continue metoprolol  succinate 25 mg daily.  # Coronary artery disease # Hypertension # Hyperlipidemia # Demand ischemia, setting of acute severe anemia Patient without chest pain, SOB. Recent LHC 12/16/2023 with mild to moderate diffuse CAD.  -Home aspirin  held due to severe anemia requiring multiple blood transfusions and IV iron . -Continue home pravastatin  40 mg daily. -Continue home metoprolol  succinate as stated above. -Home irbesartan  300 mg daily held due to borderline BP. Consider resuming if this improves and remains stable.  This patient's plan of care was discussed and created with Dr. Florencio and he is in agreement.  Signed: Danita Bloch, PA-C  12/27/2023, 9:48 AM [facility] Cardiology

## 2023-12-27 NOTE — Progress Notes (Signed)
 Mobility Specialist Progress Note:    12/27/23 1118  Mobility  Activity Ambulated with assistance  Level of Assistance Contact guard assist, steadying assist  Assistive Device Front wheel walker  Distance Ambulated (ft) 200 ft  Range of Motion/Exercises Active;All extremities  Activity Response Tolerated well  Mobility visit 1 Mobility  Mobility Specialist Start Time (ACUTE ONLY) 1109  Mobility Specialist Stop Time (ACUTE ONLY) 1118  Mobility Specialist Time Calculation (min) (ACUTE ONLY) 9 min   Pt received in bed, eager for mobility. Required CGA to stand and ambulate with RW. Tolerated well, asx throughout. Returned supine, all needs met.  Sherrilee Ditty Mobility Specialist Please contact via Special educational needs teacher or  Rehab office at [phone]

## 2023-12-27 NOTE — TOC Progression Note (Signed)
 Transition of Care [facility]) - Progression Note    Patient Details  Name: Eduardo Ashley MRN: [id number] Date of Birth: 01/27/1931  [facility]) CM/SW Contact  [place] L Katonya Blecher, [place] Phone Number: 12/27/2023, 12:37 PM  Clinical Narrative:     CSW met with patient. Family was not at bedside. CSW reviewed recommendation for HHA. Patient was provided with agencies that accept his insurance. Patient stated that he was agreeable to [facility] but advised that he will let his family decide on which agency he will go with.   Patient resides alone but has a son, daughter in law and grandchildren that support him.                     Expected Discharge Plan and Services                                               Social Drivers of Health (SDOH) Interventions SDOH Screenings   Food Insecurity: No Food Insecurity (12/23/2023)  Housing: Low Risk  (12/23/2023)  Transportation Needs: No Transportation Needs (12/23/2023)  Utilities: Not At Risk (12/23/2023)  Financial Resource Strain: Low Risk  (02/13/2023)   Received from [facility]  Social Connections: Socially Isolated (12/23/2023)  Tobacco Use: Medium Risk (12/23/2023)    Readmission Risk Interventions     No data to display

## 2023-12-27 NOTE — Plan of Care (Signed)

## 2023-12-27 NOTE — Progress Notes (Signed)
 Progress Note   Patient: Eduardo Ashley FMW:[id number] DOB: Sep 16, 1930 DOA: 12/23/2023     4 DOS: the patient was seen and examined on 12/27/2023   Brief hospital course: 89 y.o. 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      He is presenting today for evaluation of worsening weakness and black tarry stools for the past week or so.  Denies abdominal pain, nausea, vomiting, diarrhea, and syncope.  He has had persistent dizziness with standing which is not new. No history of GI bleeding   In the emergency department, his initial blood pressure was soft but fluid responsive.  Hemoglobin 4.2, down from 9.8 less than a month ago.  Hemoccult positive.  Received transfusion during my encounter. Will admit to hospitalist service with plans for upper and lower endoscopy with possible video capsule endoscopy Friday.   9/11.  Patient transfused 2 units of packed red blood cells and hemoglobin came up to 6.0.  Will give another unit of packed red blood cells today and IV iron  a little later on. 9/12.  Hemoglobin 7.8 this morning.  Has not had a bowel movement since coming in.  Patient feeling okay.  Will ambulate.   9/13.  Hemoglobin 7.5.  Will give another unit of blood and IV iron  today.  Had a black bowel movement in the afternoon. 9/14.  Hemoglobin 9.0.  Monitor for bowel movements.   Assessment and Plan: * Acute blood loss anemia Hemoglobin 4.2 on admission.  Patient received 4 units of packed red blood cells and 2 IV iron  during the hospital course so far.  No procedure secondary to severe aortic stenosis.  Last hemoglobin 9.0.  Had a bowel movement yesterday that was black.  Continue to monitor bowel  movements.  Severe aortic stenosis Case discussed with physician at [facility] and recommended nonemergent replacement of valve.  They recommended continuing to watch here at [facility].  Case discussed with [facility] clinic cardiology and they will set up for the CT of the aortic valve (likely as outpatient).  Atrial flutter (HCC) EKG shows paced rhythm 65 bpm.  Stroke risk higher off anticoagulation.  CHA2DS2-VASc score 5.  Continue Toprol .  We will have to decide when to restart blood thinner.  Difficult decision.  Complete heart block [facility]) Patient has pacemaker.  Essential hypertension Continue Toprol -XL  Hyperlipidemia, unspecified On pravastatin   BPH (benign prostatic hyperplasia) On Flomax   Uncontrolled type 2 diabetes mellitus with hyperglycemia, without long-term current use of insulin  (HCC) Sugars elevated.  Patient on sliding scale.  Continue long-acting insulin  increased to 12 units.  Holding oral medications.  Last hemoglobin A1c 7.4        Subjective: Patient admitted with very low hemoglobin and upper GI bleed.  With aortic stenosis no procedures done.  Will need workup for aortic valve replacement as outpatient.  Had a black bowel movement last night.  Responded to blood with good hemoglobin 9.0.  Physical Exam: Vitals:   12/26/23 2358 12/27/23 0422 12/27/23 0756 12/27/23 1134  BP: 123/77 133/62 (!) 111/55 117/64  Pulse: 64 (!) 59 (!) 59 64  Resp: 17 16 18 18   Temp: 97.9 F (36.6 C) 97.8 F (36.6 C) 97.7 F (36.5 C) (!) 97.5 F (36.4 C)  TempSrc:   Oral Oral  SpO2: 96% 93% 98% 96%  Weight:      Height:       Physical Exam HENT:     Head: Normocephalic.  Eyes:     General: Lids are normal.  Cardiovascular:     Rate and Rhythm: Normal rate and regular rhythm.     Heart sounds: S1 normal and S2 normal. Murmur heard.     Systolic murmur is present with a grade of 3/6.  Pulmonary:     Breath sounds: No decreased breath sounds, wheezing, rhonchi or  rales.  Abdominal:     Palpations: Abdomen is soft.     Tenderness: There is no abdominal tenderness.  Musculoskeletal:     Right lower leg: No swelling.     Left lower leg: No swelling.  Skin:    General: Skin is warm.     Findings: No rash.  Neurological:     Mental Status: He is alert and oriented to person, place, and time.     Data Reviewed: Hemoglobin 9.0  Family Communication: Spoke with son at the bedside  Disposition: Status is: Inpatient Remains inpatient appropriate because: Had a black bowel movement yesterday.  Would like to see 1 next bowel movement and see what happens.  From there we will determine on when to restart the blood thinner.  Very difficult decision with bleeding and risk of stroke with atrial fibrillation.  Planned Discharge Destination: Home    Time spent: 28 minutes  Author: Charlie Patterson, MD 12/27/2023 12:07 PM  For on call review [email or web address].

## 2023-12-28 DIAGNOSIS — I4892 Unspecified atrial flutter: Secondary | ICD-10-CM | POA: Diagnosis not present

## 2023-12-28 DIAGNOSIS — I442 Atrioventricular block, complete: Secondary | ICD-10-CM | POA: Diagnosis not present

## 2023-12-28 DIAGNOSIS — D62 Acute posthemorrhagic anemia: Secondary | ICD-10-CM | POA: Diagnosis not present

## 2023-12-28 DIAGNOSIS — I350 Nonrheumatic aortic (valve) stenosis: Secondary | ICD-10-CM | POA: Diagnosis not present

## 2023-12-28 LAB — CBC
HCT: 27.2 % — ABNORMAL LOW (ref 39.0–52.0)
Hemoglobin: 8.9 g/dL — ABNORMAL LOW (ref 13.0–17.0)
MCH: 30.3 pg (ref 26.0–34.0)
MCHC: 32.7 g/dL (ref 30.0–36.0)
MCV: 92.5 fL (ref 80.0–100.0)
Platelets: 133 K/uL — ABNORMAL LOW (ref 150–400)
RBC: 2.94 MIL/uL — ABNORMAL LOW (ref 4.22–5.81)
RDW: 15.9 % — ABNORMAL HIGH (ref 11.5–15.5)
WBC: 4.7 K/uL (ref 4.0–10.5)
nRBC: 0 % (ref 0.0–0.2)

## 2023-12-28 LAB — BASIC METABOLIC PANEL WITH GFR
Anion gap: 7 (ref 5–15)
BUN: 21 mg/dL (ref 8–23)
CO2: 27 mmol/L (ref 22–32)
Calcium: 8.5 mg/dL — ABNORMAL LOW (ref 8.9–10.3)
Chloride: 104 mmol/L (ref 98–111)
Creatinine, Ser: 1 mg/dL (ref 0.61–1.24)
GFR, Estimated: >60 mL/min (ref >60)
Glucose, Bld: 132 mg/dL — ABNORMAL HIGH (ref 70–99)
Potassium: 4 mmol/L (ref 3.5–5.1)
Sodium: 138 mmol/L (ref 135–145)

## 2023-12-28 LAB — GLUCOSE, CAPILLARY
Glucose-Capillary: 211 mg/dL — ABNORMAL HIGH (ref 70–99)
Glucose-Capillary: 199 mg/dL — ABNORMAL HIGH (ref 70–99)
Glucose-Capillary: 230 mg/dL — ABNORMAL HIGH (ref 70–99)
Glucose-Capillary: 200 mg/dL — ABNORMAL HIGH (ref 70–99)

## 2023-12-28 MED ORDER — INSULIN ASPART 100 UNIT/ML IJ SOLN
3.0000 [IU] | Freq: Three times a day (TID) | INTRAMUSCULAR | Status: DC
  Administered 2023-12-28 – 2023-12-29 (×2): 3 [IU] via SUBCUTANEOUS
  Filled 2023-12-28 (×2): qty 1

## 2023-12-28 NOTE — Progress Notes (Signed)
 Physical Therapy Treatment Patient Details Name: OTHO MICHALIK MRN: [id number] DOB: [birth year] Today's Date: 12/28/2023   History of Present Illness Eduardo Ashley is a 89 y.o. male pressents to ED for evaluation of worsening weakness and black tarry stools for the past week. In the emergency department, his initial blood pressure was soft but fluid responsive.  Hemoglobin 4.2.    PT Comments  Pt seen for PT tx with pt agreeable. Pt is able to ambulate around unit with RW & supervision, without AD with min assist. Encouraged use of RW at d/c vs no AD for increased balance/stability. Pt negotiated 2 steps x 2 with R rail to simulate home entry with pt performing with supervision. Pt is able to complete 5x sit<>stand from slightly elevated EOB without BUE support for BLE strengthening. Will continue to follow pt acutely to progress balance & gait with LRAD.    If plan is discharge home, recommend the following: A little help with walking and/or transfers;A little help with bathing/dressing/bathroom;Help with stairs or ramp for entrance   Can travel by private vehicle        Equipment Recommendations  None recommended by PT    Recommendations for Other Services       Precautions / Restrictions Precautions Precautions: Fall Restrictions Weight Bearing Restrictions Per Provider Order: No     Mobility  Bed Mobility Overal bed mobility: Modified Independent             General bed mobility comments: supine<>sit HOB slightly elevated, exited R side of bed    Transfers Overall transfer level: Needs assistance Equipment used: Rolling walker (2 wheels) Transfers: Sit to/from Stand Sit to Stand: Supervision           General transfer comment: extra time to power up to standing from low EOB    Ambulation/Gait Ambulation/Gait assistance: Supervision Gait Distance (Feet):  (>200 ft) Assistive device: Rolling walker (2 wheels), None         General Gait Details: Pt  ambulated around unit with RW & supervision, ambulated another lap around nurses station without AD with min assist. PT encouraged RW use at d/c.   Stairs Stairs: Yes Stairs assistance: Supervision Stair Management: One rail Right Number of Stairs: 2 (x2 times)     Wheelchair Mobility     Tilt Bed    Modified Rankin (Stroke Patients Only)       Balance Overall balance assessment: Needs assistance Sitting-balance support: No upper extremity supported, Feet supported Sitting balance-Leahy Scale: Good     Standing balance support: No upper extremity supported, During functional activity Standing balance-Leahy Scale: Fair                              Musician Factors Affecting Communication: Hearing impaired (even with hearing aides)  Cognition Arousal: Alert Behavior During Therapy: WFL for tasks assessed/performed   PT - Cognitive impairments: No apparent impairments                         Following commands: Intact      Cueing Cueing Techniques: Verbal cues  Exercises Other Exercises Other Exercises: Pt performed 5x sit<>stand from slightly elevated EOB without BUE support with focus on BLE strengthening & endurance training; pt did note some fatigue after.    General Comments        Pertinent Vitals/Pain Pain Assessment Pain Assessment: No/denies pain  Home Living                          Prior Function            PT Goals (current goals can now be found in the care plan section) Acute Rehab PT Goals Patient Stated Goal: to go home PT Goal Formulation: With patient Time For Goal Achievement: 01/08/24 Potential to Achieve Goals: Good Progress towards PT goals: Progressing toward goals    Frequency    Min 1X/week      PT Plan      Co-evaluation              AM-PAC PT 6 Clicks Mobility   Outcome Measure  Help needed turning from your back to your side while in a flat bed  without using bedrails?: None Help needed moving from lying on your back to sitting on the side of a flat bed without using bedrails?: None Help needed moving to and from a bed to a chair (including a wheelchair)?: A Little Help needed standing up from a chair using your arms (e.g., wheelchair or bedside chair)?: A Little Help needed to walk in hospital room?: A Little Help needed climbing 3-5 steps with a railing? : A Little 6 Click Score: 20    End of Session   Activity Tolerance: Patient tolerated treatment well Patient left: in bed;with bed alarm set;with call bell/phone within reach;with family/visitor present Nurse Communication: Mobility status PT Visit Diagnosis: Unsteadiness on feet (R26.81);Muscle weakness (generalized) (M62.81)     Time: 8884-8873 PT Time Calculation (min) (ACUTE ONLY): 11 min  Charges:    $Therapeutic Activity: 8-22 mins PT General Charges $$ ACUTE PT VISIT: 1 Visit                     Richerd Pinal, PT, DPT 12/28/23, 12:26 PM   Richerd CHRISTELLA Pinal 12/28/2023, 12:25 PM

## 2023-12-28 NOTE — Progress Notes (Signed)
 Mobility Specialist Progress Note:    12/28/23 0840  Mobility  Activity Ambulated with assistance  Level of Assistance Standby assist, set-up cues, supervision of patient - no hands on  Assistive Device Front wheel walker  Distance Ambulated (ft) 500 ft  Range of Motion/Exercises Active;All extremities  Activity Response Tolerated well  Mobility visit 1 Mobility  Mobility Specialist Start Time (ACUTE ONLY) 0825  Mobility Specialist Stop Time (ACUTE ONLY) 0840  Mobility Specialist Time Calculation (min) (ACUTE ONLY) 15 min   Pt received in bed, family in room. Agreeable to mobility, required supervision to stand and ambulate with RW. Tolerated well, asx throughout. Returned supine, all needs met.  Sherrilee Ditty Mobility Specialist Please contact via Special educational needs teacher or  Rehab office at [phone]

## 2023-12-28 NOTE — Plan of Care (Signed)

## 2023-12-28 NOTE — Progress Notes (Signed)
 [facility] CLINIC CARDIOLOGY PROGRESS NOTE       Patient ID: Eduardo Ashley MRN: [id number] DOB/AGE: Jul 08, 1930 89 y.o.  Admit date: 12/23/2023 Referring Physician Dr. Josette Primary Physician Gauger, Lauraine Collar, NP Primary Cardiologist Dr. Wilburn Reason for Consultation Severe AS  HPI: Eduardo Ashley is a 89 y.o. 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  Upon admission hemoglobin 4.2.  Patient with severe aortic stenosis and in process of workup with Dr. Katina as outpatient. Of note Dr. Katina sent orders to [facility] for urgent CT TAVR on 12/15/2023.  Cardiology was consulted for further evaluation patient severe aortic stenosis.  Interval history: -Patient seen and examined this AM, resting in hospital bed with family at bedside. Denies CP, SOB, overall feeling better. -Hgb 8.9 this AM.  Had dark BM this AM per patient. -Plan to proceed with outpatient CT TAVR once discharged or as inpatient if [facility] allows.   Pertinent Cardiac History (Most recent) LHC (12/16/23)     Ost LAD to Prox LAD lesion is 10% stenosed.   Prox LAD lesion is 45% stenosed.   Ost Cx lesion is 55% stenosed.   Ost RPDA to RPDA lesion is 40% stenosed.   1.  Mild to moderate diffuse CAD 2.  Continue workup for aortic valve replacement  Review of systems complete and found to be negative unless listed above    Past Medical History:  Diagnosis Date   Aortic valve stenosis    Carotid artery stenosis    Coronary artery disease    Diabetes mellitus without complication (HCC)    GERD (gastroesophageal reflux disease)    Hypertension    Neuropathy     feet   Wears hearing aid in both ears     Past Surgical History:  Procedure Laterality Date   CARDIOVERSION N/A 11/26/2023   Procedure: CARDIOVERSION;  Surgeon: Hilarie Rocher, MD;  Location: [facility] ORS;  Service: Cardiovascular;  Laterality: N/A;   LEFT HEART CATH AND CORONARY ANGIOGRAPHY Left 06/25/2017   Procedure: LEFT HEART CATH AND CORONARY ANGIOGRAPHY;  Surgeon: Hester Wolm PARAS, MD;  Location: [facility] INVASIVE CV LAB;  Service: Cardiovascular;  Laterality: Left;   LEFT HEART CATH AND CORONARY ANGIOGRAPHY Left 12/16/2023   Procedure: LEFT HEART CATH AND CORONARY ANGIOGRAPHY;  Surgeon: Katina Albright, MD;  Location: [facility] INVASIVE CV LAB;  Service: Cardiovascular;  Laterality: Left;   PACEMAKER LEADLESS INSERTION N/A 11/27/2023   Procedure: PACEMAKER LEADLESS INSERTION;  Surgeon: Ammon Blunt, MD;  Location: [facility] INVASIVE CV LAB;  Service: Cardiovascular;  Laterality: N/A;    Medications Prior to Admission  Medication Sig Dispense Refill Last Dose/Taking   acetaminophen  (TYLENOL ) 500 MG tablet Take 1,000 mg by mouth every 6 (six) hours as needed for moderate pain or headache.   Unknown   apixaban  (ELIQUIS ) 5 MG TABS tablet Take 5 mg by mouth 2 (two) times daily.   12/23/2023 at  8:00 AM   Ascorbic Acid (VITAMIN C PO) Take 1,000 mg by mouth daily. 500 mg   12/23/2023 Morning   aspirin  EC 81 MG tablet Take 81 mg by mouth daily.   12/23/2023 Morning   cholecalciferol (VITAMIN D3) 25 MCG (1000 UNIT) tablet Take 1,000 Units by mouth  daily.   12/23/2023 Morning   finasteride (PROSCAR) 5 MG tablet Take 5 mg by mouth daily.   12/23/2023 Morning   gabapentin  (NEURONTIN ) 600 MG tablet Take 600-1,200 mg by mouth See admin instructions. Take 600 mg by mouth in the morning and take 1200 mg by mouth at bedtime   12/23/2023 Morning   glimepiride (AMARYL) 2 MG tablet Take 2-4 mg by mouth See admin instructions. Take 4 mg at breakfast and 2 mg at dinner   12/23/2023 Morning   irbesartan  (AVAPRO ) 300 MG tablet Take  300 mg by mouth daily.   12/23/2023 Morning   magnesium  oxide (MAG-OX) 400 MG tablet Take 400 mg by mouth daily.   12/23/2023 Morning   metFORMIN (GLUCOPHAGE) 1000 MG tablet Take 1,000 mg by mouth 2 (two) times daily with a meal.   12/23/2023 Morning   metoprolol  succinate (TOPROL -XL) 25 MG 24 hr tablet Take 25 mg by mouth daily.   12/23/2023 Morning   pravastatin  (PRAVACHOL ) 40 MG tablet Take 40 mg by mouth at bedtime.   12/22/2023 Evening   sitaGLIPtin (JANUVIA) 100 MG tablet Take 100 mg by mouth daily.   12/23/2023 Morning   tamsulosin  (FLOMAX ) 0.4 MG CAPS capsule Take 0.8 mg by mouth at bedtime.   12/22/2023 Evening   vitamin B-12 (CYANOCOBALAMIN) 500 MCG tablet Take 500 mcg by mouth daily.   12/23/2023 Morning   Social History   Socioeconomic History   Marital status: Widowed    Spouse name: Not on file   Number of children: Not on file   Years of education: Not on file   Highest education level: Not on file  Occupational History   Not on file  Tobacco Use   Smoking status: Former    Current packs/day: 0.00    Types: Cigarettes    Quit date: 69    Years since quitting: 56.7   Smokeless tobacco: Never  Vaping Use   Vaping status: Never Used  Substance and Sexual Activity   Alcohol use: No   Drug use: Never   Sexual activity: Not on file  Other Topics Concern   Not on file  Social History Narrative   Not on file   Social Drivers of Health   Financial Resource Strain: Low Risk  (02/13/2023)   Received from [facility]   Overall Financial Resource Strain (CARDIA)    Difficulty of Paying Living Expenses: Not very hard  Food Insecurity: No Food Insecurity (12/23/2023)   Hunger Vital Sign    Worried About Running Out of Food in the Last Year: Never true    Ran Out of Food in the Last Year: Never true  Transportation Needs: No Transportation Needs (12/23/2023)   PRAPARE - Administrator, Civil Service (Medical): No    Lack of Transportation  (Non-Medical): No  Physical Activity: Not on file  Stress: Not on file  Social Connections: Socially Isolated (12/23/2023)   Social Connection and Isolation Panel    Frequency of Communication with Friends and Family: Once a week    Frequency of Social Gatherings with Friends and Family: Once a week    Attends Religious Services: More than 4 times per year    Active Member of [employer] or Organizations: No    Attends Banker Meetings: Never    Marital Status: Widowed  Intimate Partner Violence: Not At Risk (12/23/2023)   Humiliation, Afraid, Rape, and Kick questionnaire    Fear of Current or Ex-Partner: No  Emotionally Abused: No    Physically Abused: No    Sexually Abused: No    History reviewed. No pertinent family history.   Vitals:   12/27/23 2044 12/28/23 0009 12/28/23 0341 12/28/23 0734  BP: (!) 128/58 (!) 140/62 (!) 129/47 127/65  Pulse: 65 64 64 61  Resp:  16 12 16   Temp: 98.5 F (36.9 C) 98.1 F (36.7 C) 97.7 F (36.5 C) 98.1 F (36.7 C)  TempSrc:      SpO2: 98% 98% 93% 99%  Weight:      Height:        PHYSICAL EXAM General: Well-appearing elderly male, well nourished, in no acute distress. HEENT: Normocephalic and atraumatic. Neck: No JVD.   Lungs: Normal respiratory effort on room air. Clear bilaterally to auscultation. No wheezes, crackles, rhonchi.  Heart: HRRR. Normal S1 and S2, + systolic murmur.  Abdomen: Non-distended appearing.  Msk: Normal strength and tone for age. Extremities: Warm and well perfused. No clubbing, cyanosis, edema.  Neuro: Alert and oriented X 3. Psych: Answers questions appropriately.   Labs: Basic Metabolic Panel: Recent Labs    12/28/23 0318  NA 138  K 4.0  CL 104  CO2 27  GLUCOSE 132*  BUN 21  CREATININE 1.00  CALCIUM 8.5*    Liver Function Tests: No results for input(s): AST, ALT, ALKPHOS, BILITOT, PROT, ALBUMIN in the last 72 hours.  No results for input(s): LIPASE, AMYLASE in the last  72 hours. CBC: Recent Labs    12/26/23 0431 12/27/23 0621 12/28/23 0318  WBC 3.5*  --  4.7  HGB 7.5* 9.0* 8.9*  HCT 23.1*  --  27.2*  MCV 92.0  --  92.5  PLT 131*  --  133*   Cardiac Enzymes: No results for input(s): CKTOTAL, CKMB, CKMBINDEX, TROPONINIHS in the last 72 hours.  BNP: No results for input(s): BNP in the last 72 hours. D-Dimer: No results for input(s): DDIMER in the last 72 hours. Hemoglobin A1C: No results for input(s): HGBA1C in the last 72 hours. Fasting Lipid Panel: No results for input(s): CHOL, HDL, LDLCALC, TRIG, CHOLHDL, LDLDIRECT in the last 72 hours. Thyroid  Function Tests: No results for input(s): TSH, T4TOTAL, T3FREE, THYROIDAB in the last 72 hours.  Invalid input(s): FREET3 Anemia Panel: No results for input(s): VITAMINB12, FOLATE, FERRITIN, TIBC, IRON , RETICCTPCT in the last 72 hours.    Radiology: DG Chest Portable 1 View Result Date: 12/23/2023 CLINICAL DATA:  Weakness EXAM: PORTABLE CHEST 1 VIEW COMPARISON:  11/30/2023 FINDINGS: The list pacer noted. Atherosclerotic calcification of the aortic arch. Mild enlargement of the cardiopericardial silhouette Mild scarring peripherally at the left lung base. Mildly low lung volumes. Lower thoracic spondylosis. IMPRESSION: 1. Mild enlargement of the cardiopericardial silhouette, without edema. 2. Mild scarring peripherally at the left lung base. 3. Lower thoracic spondylosis. Electronically Signed   By: Ryan Salvage M.D.   On: 12/23/2023 13:05   CARDIAC CATHETERIZATION Result Date: 12/16/2023   Ost LAD to Prox LAD lesion is 10% stenosed.   Prox LAD lesion is 45% stenosed.   Ost Cx lesion is 55% stenosed.   Ost RPDA to RPDA lesion is 40% stenosed. 1.  Mild to moderate diffuse CAD 2.  Continue workup for aortic valve replacement   CT Cervical Spine Wo Contrast Result Date: 11/30/2023 CLINICAL DATA:  Clemens, near syncope, recent pacemaker placement EXAM: CT  CERVICAL SPINE WITHOUT CONTRAST TECHNIQUE: Multidetector CT imaging of the cervical spine was performed without intravenous contrast. Multiplanar CT image reconstructions were  also generated. RADIATION DOSE REDUCTION: This exam was performed according to the departmental dose-optimization program which includes automated exposure control, adjustment of the mA and/or kV according to patient size and/or use of iterative reconstruction technique. COMPARISON:  12/03/2022 FINDINGS: Alignment: Alignment is anatomic. Skull base and vertebrae: No acute fracture. No primary bone lesion or focal pathologic process. Soft tissues and spinal canal: No prevertebral fluid or swelling. No visible canal hematoma. Marked atherosclerosis at the carotid bifurcations. Disc levels: Disc spaces are well preserved. Mild facet hypertrophic changes on the left at C2-3 and C3-4. Upper chest: Airway is patent. Visualized portions of the lung apices are clear. Other: Reconstructed images demonstrate no additional findings. IMPRESSION: 1. No acute cervical spine fracture. Electronically Signed   By: Ozell Daring M.D.   On: 11/30/2023 17:19   CT Head Wo Contrast Result Date: 11/30/2023 CLINICAL DATA:  Pacemaker placement on Friday, near syncopal episode, orthostatic hypotension EXAM: CT HEAD WITHOUT CONTRAST TECHNIQUE: Contiguous axial images were obtained from the base of the skull through the vertex without intravenous contrast. RADIATION DOSE REDUCTION: This exam was performed according to the departmental dose-optimization program which includes automated exposure control, adjustment of the mA and/or kV according to patient size and/or use of iterative reconstruction technique. COMPARISON:  None Available. FINDINGS: Brain: No acute infarct or hemorrhage. Focal hypodensity within the right basal ganglia consistent with chronic lacunar infarct. Lateral ventricles and remaining midline structures are unremarkable. No acute extra-axial fluid  collections. No mass effect. Vascular: No hyperdense vessel or unexpected calcification. Skull: Normal. Negative for fracture or focal lesion. Sinuses/Orbits: No acute finding. Other: None. IMPRESSION: 1. No acute intracranial process. Electronically Signed   By: Ozell Daring M.D.   On: 11/30/2023 17:17   DG Chest 2 View Result Date: 11/30/2023 CLINICAL DATA:  Recent pacemaker.  Lightheadedness. EXAM: CHEST - 2 VIEW COMPARISON:  None Available. FINDINGS: The heart size and mediastinal contours are within normal limits. There is atelectasis in the left lung base. The lungs are otherwise clear. There is no pleural effusion or pneumothorax. The visualized skeletal structures are unremarkable. IMPRESSION: Left basilar atelectasis. Electronically Signed   By: Greig Pique M.D.   On: 11/30/2023 16:05    ECHO 09/10/2023 NORMAL LEFT VENTRICULAR SYSTOLIC FUNCTION WITH MODERATE LVH  ESTIMATED EF: >55%  NORMAL LA PRESSURES WITH DIASTOLIC DYSFUNCTION (GRADE 1)  NORMAL RIGHT VENTRICULAR SYSTOLIC FUNCTION  VALVULAR REGURGITATION: No AR, MODERATE MR, No PR, MILD TR  ESTIMATED RVSP: 44 mmHg  VALVULAR STENOSIS: SEVERE AS, No MS, No PS, No TS   TELEMETRY reviewed by me 12/28/2023: Ventricular paced, rate 60s  EKG reviewed by me: Ventricular paced rhythm, rate 65 bpm  Data reviewed by me 12/28/2023: last 24h vitals tele labs imaging I/O hospitalist progress notes.  Principal Problem:   Acute blood loss anemia Active Problems:   Complete heart block (HCC)   Atrial flutter (HCC)   Essential hypertension   Hyperlipidemia, unspecified   Severe aortic stenosis   Coronary artery disease   Uncontrolled type 2 diabetes mellitus with hyperglycemia, without long-term current use of insulin  (HCC)   BPH (benign prostatic hyperplasia)    ASSESSMENT AND PLAN:  Eduardo Ashley is a 89 y.o. 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  Upon admission hemoglobin 4.2.  Patient with severe aortic stenosis and in process of workup with Dr. Katina as outpatient. Of note Dr. Katina sent orders to [facility] for urgent CT TAVR on 12/15/2023.  Cardiology was consulted for further evaluation patient severe aortic stenosis.  # Acute blood loss anemia Hgb 4.2 on admission, received 4X blood transfusions and IV iron . Hgb 8.9 this AM. Did have dark BM this AM (09/15) - Gastroenterology consulted, no plan to perform endoscopy due to patients severe AS.  - Monitor H&H closely. - Recommend Hgb > 8 with cardiac history.   # Severe aortic stenosis Patient with recent LHC on 09/03 for aortic stenosis workup.  CT TAVR scheduled for 9/12 at [facility]. Discussed case with Dr. Katina (09/12) and recommends to attempt to have CT TAVR imaging done as an inpatient at [facility]. If [facility] unable to perform, then stabilize Hgb and reschedule CT TAVR imaging as outpatient next week. -Attempt to have TAVR imaging done while inpatient, awaiting for radiology response.  -Plan is if patient gets discharged  will scheduled CT TAVR as outpatient later this week at either [facility] or [facility]. Will ensure images get sent to [facility] for Dr. Katina to review if scan done at [facility].   # Paroxysmal atrial fibrillation/flutter # High-grade AV block s/p Micra placed pacemaker (11/2023) - Home Eliquis  held due to severe acute anemia requiring 4x blood transfusions.  Recommend resuming when hemoglobin stabilizes, no evidence of active bleeding and when GI clears patient to resume. Can consider holding on DC and following up labs outpatient and resuming eliquis  in 1-2 weeks. -Continue metoprolol  succinate 25 mg daily.  #  Coronary artery disease # Hypertension # Hyperlipidemia # Demand ischemia, setting of acute severe anemia Patient without chest pain, SOB. Recent LHC 12/16/2023 with mild to moderate diffuse CAD.  -Home aspirin  held due to severe anemia requiring multiple blood transfusions and IV iron . -Continue home pravastatin  40 mg daily. -Continue home metoprolol  succinate as stated above. -Home irbesartan  300 mg daily held due to borderline BP. Consider resuming if this improves and remains stable.  This patient's plan of care was discussed and created with Dr. Custovic and he is in agreement.  Signed: Dorene Comfort, PA-C  12/28/2023, 10:35 AM [facility]

## 2023-12-28 NOTE — Progress Notes (Signed)
 Mobility Specialist Progress Note:    12/28/23 1649  Mobility  Activity Ambulated with assistance  Level of Assistance Standby assist, set-up cues, supervision of patient - no hands on  Assistive Device Front wheel walker  Distance Ambulated (ft) 500 ft  Range of Motion/Exercises Active;All extremities  Activity Response Tolerated well  Mobility visit 1 Mobility  Mobility Specialist Start Time (ACUTE ONLY) 1630  Mobility Specialist Stop Time (ACUTE ONLY) 1649  Mobility Specialist Time Calculation (min) (ACUTE ONLY) 19 min     Sherlon Nied Mobility Specialist Please contact via Special educational needs teacher or  Rehab office at [phone]

## 2023-12-28 NOTE — Progress Notes (Signed)
 Progress Note   Patient: Eduardo Ashley FMW:[id number] DOB: 1930-09-05 DOA: 12/23/2023     5 DOS: the patient was seen and examined on 12/28/2023   Brief hospital course: 89 y.o. 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      He is presenting today for evaluation of worsening weakness and black tarry stools for the past week or so.  Denies abdominal pain, nausea, vomiting, diarrhea, and syncope.  He has had persistent dizziness with standing which is not new. No history of GI bleeding   In the emergency department, his initial blood pressure was soft but fluid responsive.  Hemoglobin 4.2, down from 9.8 less than a month ago.  Hemoccult positive.  Received transfusion during my encounter. Will admit to hospitalist service with plans for upper and lower endoscopy with possible video capsule endoscopy Friday.   9/11.  Patient transfused 2 units of packed red blood cells and hemoglobin came up to 6.0.  Will give another unit of packed red blood cells today and IV iron  a little later on. 9/12.  Hemoglobin 7.8 this morning.  Has not had a bowel movement since coming in.  Patient feeling okay.  Will ambulate.   9/13.  Hemoglobin 7.5.  Will give another unit of blood and IV iron  today.  Had a black bowel movement in the afternoon. 9/14.  Hemoglobin 9.0.  Monitor for bowel movements. 9/15.  Hemoglobin 8.9.  Had another black bowel movement this morning.  Continue to monitor   Assessment and Plan: * Acute blood loss anemia Hemoglobin 4.2 on admission.  Patient received 4 units of packed red blood cells and 2 IV iron  during the hospital course so far.  No procedure secondary to severe aortic stenosis.  Last hemoglobin  8.9.  Had another black bowel movement this morning  Severe aortic stenosis Case discussed with physician at [facility] and recommended nonemergent replacement of valve.  They recommended continuing to watch here at [facility].  Case discussed with [facility] clinic cardiology and they will set up for the CT of the aortic valve (they will see if they can do inpatient versus having to do outpatient)  Atrial flutter (HCC) EKG shows paced rhythm 65 bpm.  Stroke risk higher off anticoagulation.  CHA2DS2-VASc score 5.  Continue Toprol .  Holding off on blood thinner with residual black bowel movement.  Complete heart block [facility]) Patient has pacemaker.  Essential hypertension Continue Toprol -XL  Hyperlipidemia, unspecified On pravastatin   BPH (benign prostatic hyperplasia) On Flomax   Uncontrolled type 2 diabetes mellitus with hyperglycemia, without long-term current use of insulin  (HCC) Sugars elevated.  Continue long-acting insulin  increased to 12 units.  3 units of short acting insulin  plus sliding scale.  Holding oral medications.  Last hemoglobin A1c 7.4        Subjective: Patient feels okay offers no complaints.  Had another black bowel movement this morning.  Admitted with very low hemoglobin and GI bleed.  Physical Exam: Vitals:   12/28/23 0009 12/28/23 0341 12/28/23 0734 12/28/23 1204  BP: (!) 140/62 (!) 129/47 127/65 127/69  Pulse: 64 64 61 61  Resp: 16 12 16    Temp: 98.1 F (36.7 C) 97.7 F (36.5 C) 98.1 F (36.7 C) 98.2 F (36.8 C)  TempSrc:    Oral  SpO2:  98% 93% 99% 100%  Weight:      Height:       Physical Exam HENT:     Head: Normocephalic.  Eyes:     General: Lids are normal.  Cardiovascular:     Rate and Rhythm: Normal rate and regular rhythm.     Heart sounds: S1 normal and S2 normal. Murmur heard.     Systolic murmur is present with a grade of 3/6.  Pulmonary:     Breath sounds: No decreased breath sounds, wheezing, rhonchi or rales.  Abdominal:      Palpations: Abdomen is soft.     Tenderness: There is no abdominal tenderness.  Musculoskeletal:     Right lower leg: No swelling.     Left lower leg: No swelling.  Skin:    General: Skin is warm.     Findings: No rash.  Neurological:     Mental Status: He is alert and oriented to person, place, and time.     Data Reviewed: Hemoglobin 8.9, creatinine 1.0  Family Communication: Spoke with son at bedside  Disposition: Status is: Inpatient Remains inpatient appropriate because: Still having black bowel movement.  I think this is likely residual blood coming out since hemoglobin is stable.  Planned Discharge Destination: Home    Time spent: 28 minutes  Author: Charlie Patterson, MD 12/28/2023 1:06 PM  For on call review [email or web address].

## 2023-12-28 NOTE — Plan of Care (Signed)
   Problem: Education: Goal: Ability to describe self-care measures that may prevent or decrease complications (Diabetes Survival Skills Education) will improve Outcome: Progressing   Problem: Coping: Goal: Ability to adjust to condition or change in health will improve Outcome: Progressing   Problem: Fluid Volume: Goal: Ability to maintain a balanced intake and output will improve Outcome: Progressing

## 2023-12-28 NOTE — Inpatient Diabetes Management (Signed)
 Inpatient Diabetes Program Recommendations  AACE/ADA: New Consensus Statement on Inpatient Glycemic Control (2015)  Target Ranges:  Prepandial:   less than 140 mg/dL      Peak postprandial:   less than 180 mg/dL (1-2 hours)      Critically ill patients:  140 - 180 mg/dL   Lab Results  Component Value Date   GLUCAP 211 (H) 12/28/2023   HGBA1C 7.4 (H) 11/26/2023    Review of Glycemic Control  Latest Reference Range & Units 12/27/23 07:58 12/27/23 12:10 12/27/23 16:55 12/27/23 20:42 12/28/23 07:36  Glucose-Capillary 70 - 99 mg/dL 850 (H) 837 (H) 677 (H) 297 (H) 211 (H)  (H): Data is abnormally high  Diabetes history: DM2 Outpatient Diabetes medications: Amaryl 4 QAM, 2 QPM, Januvia 100 mg daily  Current orders for Inpatient glycemic control: Lantus  12 units every day, Novolog  0-9 units TID and 0-5 units QHS  Inpatient Diabetes Program Recommendations:    Might consider Novolog  3 units TID with meals if he consumes at least 50%.  Thank you, Wyvonna Pinal, MSN, CDCES Diabetes Coordinator Inpatient Diabetes Program [phone] (team pager from 8a-5p)

## 2023-12-29 DIAGNOSIS — I442 Atrioventricular block, complete: Secondary | ICD-10-CM | POA: Diagnosis not present

## 2023-12-29 DIAGNOSIS — I350 Nonrheumatic aortic (valve) stenosis: Secondary | ICD-10-CM | POA: Diagnosis not present

## 2023-12-29 DIAGNOSIS — D62 Acute posthemorrhagic anemia: Secondary | ICD-10-CM | POA: Diagnosis not present

## 2023-12-29 DIAGNOSIS — I4892 Unspecified atrial flutter: Secondary | ICD-10-CM | POA: Diagnosis not present

## 2023-12-29 LAB — GLUCOSE, CAPILLARY
Glucose-Capillary: 102 mg/dL — ABNORMAL HIGH (ref 70–99)
Glucose-Capillary: 177 mg/dL — ABNORMAL HIGH (ref 70–99)

## 2023-12-29 LAB — HEMOGLOBIN: Hemoglobin: 9.3 g/dL — ABNORMAL LOW (ref 13.0–17.0)

## 2023-12-29 MED ORDER — GLUCERNA SHAKE PO LIQD: 237.0000 mL | Freq: Three times a day (TID) | ORAL | 0 refills | Status: AC

## 2023-12-29 MED ORDER — POLYETHYLENE GLYCOL 3350 17 G PO PACK: 17.0000 g | PACK | Freq: Once | ORAL | Status: DC

## 2023-12-29 MED ORDER — POLYETHYLENE GLYCOL 3350 17 G PO PACK: 17.0000 g | PACK | Freq: Every evening | ORAL | Status: DC

## 2023-12-29 MED ORDER — LACTULOSE 10 GM/15ML PO SOLN
30.0000 g | Freq: Once | ORAL | Status: AC
  Administered 2023-12-29: 30 g via ORAL
  Filled 2023-12-29: qty 60
  Filled 2023-12-29: qty 45

## 2023-12-29 MED ORDER — POLYETHYLENE GLYCOL 3350 17 G PO PACK: 17.0000 g | PACK | Freq: Every day | ORAL | 0 refills | Status: AC | PRN

## 2023-12-29 MED ORDER — PANTOPRAZOLE SODIUM 40 MG PO TBEC: 40.0000 mg | DELAYED_RELEASE_TABLET | Freq: Every day | ORAL | 0 refills | Status: AC

## 2023-12-29 NOTE — TOC Transition Note (Signed)
 Transition of Care [facility]) - Discharge Note   Patient Details  Name: Eduardo Ashley MRN: [id number] Date of Birth: 06-May-1930  [facility] CM/SW Contact:  Racheal LITTIE Schimke, RN Phone Number: 12/29/2023, 2:33 PM    Final next level of care: Home w Home Health Services Barriers to Discharge: Barriers Resolved   Patient Goals and CMS Choice Patient states their goals for this hospitalization and ongoing recovery are:: To return home with home health. CMS Medicare.gov Compare Post Acute Care list provided to:: Patient Choice offered to / list presented to : Patient      Discharge Placement                    Patient and family notified of of transfer: 12/29/23  Discharge Plan and Services Additional resources added to the After Visit Summary for                  DME Arranged: N/A DME Agency: NA       HH Arranged: PT, RN, Nurse's Aide HH Agency: [facility] Date HH Agency Contacted: 12/29/23   Representative spoke with at [facility] Agency: [place]  Social Drivers of Health (SDOH) Interventions SDOH Screenings   Food Insecurity: No Food Insecurity (12/23/2023)  Housing: Low Risk  (12/23/2023)  Transportation Needs: No Transportation Needs (12/23/2023)  Utilities: Not At Risk (12/23/2023)  Financial Resource Strain: Low Risk  (02/13/2023)   Received from [facility]  Social Connections: Socially Isolated (12/23/2023)  Tobacco Use: Medium Risk (12/23/2023)     Readmission Risk Interventions     No data to display

## 2023-12-29 NOTE — Progress Notes (Addendum)
 [facility] CLINIC CARDIOLOGY PROGRESS NOTE       Patient ID: Eduardo Ashley MRN: [id number] DOB/AGE: 1930/05/28 89 y.o.  Admit date: 12/23/2023 Referring Physician Dr. Josette Primary Physician Gauger, Lauraine Collar, NP Primary Cardiologist Dr. Wilburn Reason for Consultation Severe AS  HPI: Eduardo Ashley is a 89 y.o. 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  Upon admission hemoglobin 4.2.  Patient with severe aortic stenosis and in process of workup with Dr. Katina as outpatient. Of note Dr. Katina sent orders to [facility] for urgent CT TAVR on 12/15/2023.  Cardiology was consulted for further evaluation patient severe aortic stenosis.  Interval history: -Patient seen and examined this afternoon, resting in hospital bed with family at bedside. Denies CP, SOB, overall feeling better. -Hgb 9,3 this AM.  Dark brown stool today. Hgb stable.  -Plan to proceed with outpatient CT TAVR once discharged or as inpatient if [facility] allows.   Pertinent Cardiac History (Most recent) LHC (12/16/23)     Ost LAD to Prox LAD lesion is 10% stenosed.   Prox LAD lesion is 45% stenosed.   Ost Cx lesion is 55% stenosed.   Ost RPDA to RPDA lesion is 40% stenosed.   1.  Mild to moderate diffuse CAD 2.  Continue workup for aortic valve replacement  Review of systems complete and found to be negative unless listed above    Past Medical History:  Diagnosis Date   Aortic valve stenosis    Carotid artery stenosis    Coronary artery disease    Diabetes mellitus without complication (HCC)    GERD (gastroesophageal reflux disease)    Hypertension     Neuropathy    feet   Wears hearing aid in both ears     Past Surgical History:  Procedure Laterality Date   CARDIOVERSION N/A 11/26/2023   Procedure: CARDIOVERSION;  Surgeon: Hilarie Rocher, MD;  Location: [facility] ORS;  Service: Cardiovascular;  Laterality: N/A;   LEFT HEART CATH AND CORONARY ANGIOGRAPHY Left 06/25/2017   Procedure: LEFT HEART CATH AND CORONARY ANGIOGRAPHY;  Surgeon: Hester Wolm PARAS, MD;  Location: [facility] INVASIVE CV LAB;  Service: Cardiovascular;  Laterality: Left;   LEFT HEART CATH AND CORONARY ANGIOGRAPHY Left 12/16/2023   Procedure: LEFT HEART CATH AND CORONARY ANGIOGRAPHY;  Surgeon: Katina Albright, MD;  Location: [facility] INVASIVE CV LAB;  Service: Cardiovascular;  Laterality: Left;   PACEMAKER LEADLESS INSERTION N/A 11/27/2023   Procedure: PACEMAKER LEADLESS INSERTION;  Surgeon: Ammon Blunt, MD;  Location: [facility] INVASIVE CV LAB;  Service: Cardiovascular;  Laterality: N/A;    Medications Prior to Admission  Medication Sig Dispense Refill Last Dose/Taking   acetaminophen  (TYLENOL ) 500 MG tablet Take 1,000 mg by mouth every 6 (six) hours as needed for moderate pain or headache.   Unknown   apixaban  (ELIQUIS ) 5 MG TABS tablet Take 5 mg by mouth 2 (two) times daily.   12/23/2023 at  8:00 AM   Ascorbic Acid (VITAMIN C PO) Take 1,000 mg by mouth daily. 500 mg   12/23/2023 Morning   aspirin  EC 81 MG tablet Take 81 mg by mouth daily.   12/23/2023 Morning   cholecalciferol (VITAMIN D3) 25 MCG (1000 UNIT) tablet Take 1,000 Units by mouth  daily.   12/23/2023 Morning   finasteride (PROSCAR) 5 MG tablet Take 5 mg by mouth daily.   12/23/2023 Morning   gabapentin  (NEURONTIN ) 600 MG tablet Take 600-1,200 mg by mouth See admin instructions. Take 600 mg by mouth in the morning and take 1200 mg by mouth at bedtime   12/23/2023 Morning   glimepiride (AMARYL) 2 MG tablet Take 2-4 mg by mouth See admin instructions. Take 4 mg at breakfast and 2 mg at dinner   12/23/2023 Morning   irbesartan  (AVAPRO ) 300  MG tablet Take 300 mg by mouth daily.   12/23/2023 Morning   magnesium  oxide (MAG-OX) 400 MG tablet Take 400 mg by mouth daily.   12/23/2023 Morning   metFORMIN (GLUCOPHAGE) 1000 MG tablet Take 1,000 mg by mouth 2 (two) times daily with a meal.   12/23/2023 Morning   metoprolol  succinate (TOPROL -XL) 25 MG 24 hr tablet Take 25 mg by mouth daily.   12/23/2023 Morning   pravastatin  (PRAVACHOL ) 40 MG tablet Take 40 mg by mouth at bedtime.   12/22/2023 Evening   sitaGLIPtin (JANUVIA) 100 MG tablet Take 100 mg by mouth daily.   12/23/2023 Morning   tamsulosin  (FLOMAX ) 0.4 MG CAPS capsule Take 0.8 mg by mouth at bedtime.   12/22/2023 Evening   vitamin B-12 (CYANOCOBALAMIN) 500 MCG tablet Take 500 mcg by mouth daily.   12/23/2023 Morning   Social History   Socioeconomic History   Marital status: Widowed    Spouse name: Not on file   Number of children: Not on file   Years of education: Not on file   Highest education level: Not on file  Occupational History   Not on file  Tobacco Use   Smoking status: Former    Current packs/day: 0.00    Types: Cigarettes    Quit date: 48    Years since quitting: 56.7   Smokeless tobacco: Never  Vaping Use   Vaping status: Never Used  Substance and Sexual Activity   Alcohol use: No   Drug use: Never   Sexual activity: Not on file  Other Topics Concern   Not on file  Social History Narrative   Not on file   Social Drivers of Health   Financial Resource Strain: Low Risk  (02/13/2023)   Received from [facility]   Overall Financial Resource Strain (CARDIA)    Difficulty of Paying Living Expenses: Not very hard  Food Insecurity: No Food Insecurity (12/23/2023)   Hunger Vital Sign    Worried About Running Out of Food in the Last Year: Never true    Ran Out of Food in the Last Year: Never true  Transportation Needs: No Transportation Needs (12/23/2023)   PRAPARE - Administrator, Civil Service (Medical): No    Lack of  Transportation (Non-Medical): No  Physical Activity: Not on file  Stress: Not on file  Social Connections: Socially Isolated (12/23/2023)   Social Connection and Isolation Panel    Frequency of Communication with Friends and Family: Once a week    Frequency of Social Gatherings with Friends and Family: Once a week    Attends Religious Services: More than 4 times per year    Active Member of [employer] or Organizations: No    Attends Banker Meetings: Never    Marital Status: Widowed  Intimate Partner Violence: Not At Risk (12/23/2023)   Humiliation, Afraid, Rape, and Kick questionnaire    Fear of Current or Ex-Partner: No  Emotionally Abused: No    Physically Abused: No    Sexually Abused: No    History reviewed. No pertinent family history.   Vitals:   12/28/23 2316 12/29/23 0334 12/29/23 0751 12/29/23 1208  BP: (!) 145/62 (!) 133/47 (!) 128/48 (!) 124/51  Pulse: 61 67 72 61  Resp: 18 18 18 18   Temp: 99 F (37.2 C) 98.7 F (37.1 C) 99.5 F (37.5 C) 98.4 F (36.9 C)  TempSrc:   Oral Oral  SpO2: 96% 99% 95% 95%  Weight:      Height:        PHYSICAL EXAM General: Well-appearing elderly male, well nourished, in no acute distress. HEENT: Normocephalic and atraumatic. Neck: No JVD.   Lungs: Normal respiratory effort on room air. Clear bilaterally to auscultation. No wheezes, crackles, rhonchi.  Heart: HRRR. Normal S1 and S2, + systolic murmur.  Abdomen: Non-distended appearing.  Msk: Normal strength and tone for age. Extremities: Warm and well perfused. No clubbing, cyanosis, edema.  Neuro: Alert and oriented X 3. Psych: Answers questions appropriately.   Labs: Basic Metabolic Panel: Recent Labs    12/28/23 0318  NA 138  K 4.0  CL 104  CO2 27  GLUCOSE 132*  BUN 21  CREATININE 1.00  CALCIUM 8.5*    Liver Function Tests: No results for input(s): AST, ALT, ALKPHOS, BILITOT, PROT, ALBUMIN in the last 72 hours.  No results for input(s):  LIPASE, AMYLASE in the last 72 hours. CBC: Recent Labs    12/28/23 0318 12/29/23 0316  WBC 4.7  --   HGB 8.9* 9.3*  HCT 27.2*  --   MCV 92.5  --   PLT 133*  --    Cardiac Enzymes: No results for input(s): CKTOTAL, CKMB, CKMBINDEX, TROPONINIHS in the last 72 hours.  BNP: No results for input(s): BNP in the last 72 hours. D-Dimer: No results for input(s): DDIMER in the last 72 hours. Hemoglobin A1C: No results for input(s): HGBA1C in the last 72 hours. Fasting Lipid Panel: No results for input(s): CHOL, HDL, LDLCALC, TRIG, CHOLHDL, LDLDIRECT in the last 72 hours. Thyroid  Function Tests: No results for input(s): TSH, T4TOTAL, T3FREE, THYROIDAB in the last 72 hours.  Invalid input(s): FREET3 Anemia Panel: No results for input(s): VITAMINB12, FOLATE, FERRITIN, TIBC, IRON , RETICCTPCT in the last 72 hours.    Radiology: DG Chest Portable 1 View Result Date: 12/23/2023 CLINICAL DATA:  Weakness EXAM: PORTABLE CHEST 1 VIEW COMPARISON:  11/30/2023 FINDINGS: The list pacer noted. Atherosclerotic calcification of the aortic arch. Mild enlargement of the cardiopericardial silhouette Mild scarring peripherally at the left lung base. Mildly low lung volumes. Lower thoracic spondylosis. IMPRESSION: 1. Mild enlargement of the cardiopericardial silhouette, without edema. 2. Mild scarring peripherally at the left lung base. 3. Lower thoracic spondylosis. Electronically Signed   By: Ryan Salvage M.D.   On: 12/23/2023 13:05   CARDIAC CATHETERIZATION Result Date: 12/16/2023   Ost LAD to Prox LAD lesion is 10% stenosed.   Prox LAD lesion is 45% stenosed.   Ost Cx lesion is 55% stenosed.   Ost RPDA to RPDA lesion is 40% stenosed. 1.  Mild to moderate diffuse CAD 2.  Continue workup for aortic valve replacement   CT Cervical Spine Wo Contrast Result Date: 11/30/2023 CLINICAL DATA:  Clemens, near syncope, recent pacemaker placement EXAM: CT CERVICAL  SPINE WITHOUT CONTRAST TECHNIQUE: Multidetector CT imaging of the cervical spine was performed without intravenous contrast. Multiplanar CT image reconstructions were also generated. RADIATION DOSE REDUCTION: This  exam was performed according to the departmental dose-optimization program which includes automated exposure control, adjustment of the mA and/or kV according to patient size and/or use of iterative reconstruction technique. COMPARISON:  12/03/2022 FINDINGS: Alignment: Alignment is anatomic. Skull base and vertebrae: No acute fracture. No primary bone lesion or focal pathologic process. Soft tissues and spinal canal: No prevertebral fluid or swelling. No visible canal hematoma. Marked atherosclerosis at the carotid bifurcations. Disc levels: Disc spaces are well preserved. Mild facet hypertrophic changes on the left at C2-3 and C3-4. Upper chest: Airway is patent. Visualized portions of the lung apices are clear. Other: Reconstructed images demonstrate no additional findings. IMPRESSION: 1. No acute cervical spine fracture. Electronically Signed   By: Ozell Daring M.D.   On: 11/30/2023 17:19   CT Head Wo Contrast Result Date: 11/30/2023 CLINICAL DATA:  Pacemaker placement on Friday, near syncopal episode, orthostatic hypotension EXAM: CT HEAD WITHOUT CONTRAST TECHNIQUE: Contiguous axial images were obtained from the base of the skull through the vertex without intravenous contrast. RADIATION DOSE REDUCTION: This exam was performed according to the departmental dose-optimization program which includes automated exposure control, adjustment of the mA and/or kV according to patient size and/or use of iterative reconstruction technique. COMPARISON:  None Available. FINDINGS: Brain: No acute infarct or hemorrhage. Focal hypodensity within the right basal ganglia consistent with chronic lacunar infarct. Lateral ventricles and remaining midline structures are unremarkable. No acute extra-axial fluid  collections. No mass effect. Vascular: No hyperdense vessel or unexpected calcification. Skull: Normal. Negative for fracture or focal lesion. Sinuses/Orbits: No acute finding. Other: None. IMPRESSION: 1. No acute intracranial process. Electronically Signed   By: Ozell Daring M.D.   On: 11/30/2023 17:17   DG Chest 2 View Result Date: 11/30/2023 CLINICAL DATA:  Recent pacemaker.  Lightheadedness. EXAM: CHEST - 2 VIEW COMPARISON:  None Available. FINDINGS: The heart size and mediastinal contours are within normal limits. There is atelectasis in the left lung base. The lungs are otherwise clear. There is no pleural effusion or pneumothorax. The visualized skeletal structures are unremarkable. IMPRESSION: Left basilar atelectasis. Electronically Signed   By: Greig Pique M.D.   On: 11/30/2023 16:05    ECHO 09/10/2023 NORMAL LEFT VENTRICULAR SYSTOLIC FUNCTION WITH MODERATE LVH  ESTIMATED EF: >55%  NORMAL LA PRESSURES WITH DIASTOLIC DYSFUNCTION (GRADE 1)  NORMAL RIGHT VENTRICULAR SYSTOLIC FUNCTION  VALVULAR REGURGITATION: No AR, MODERATE MR, No PR, MILD TR  ESTIMATED RVSP: 44 mmHg  VALVULAR STENOSIS: SEVERE AS, No MS, No PS, No TS   TELEMETRY reviewed by me 12/29/2023: Ventricular paced, rate 60s  EKG reviewed by me: Ventricular paced rhythm, rate 65 bpm  Data reviewed by me 12/29/2023: last 24h vitals tele labs imaging I/O hospitalist progress notes.  Principal Problem:   Acute blood loss anemia Active Problems:   Complete heart block (HCC)   Atrial flutter (HCC)   Essential hypertension   Hyperlipidemia, unspecified   Severe aortic stenosis   Coronary artery disease   Uncontrolled type 2 diabetes mellitus with hyperglycemia, without long-term current use of insulin  (HCC)   BPH (benign prostatic hyperplasia)    ASSESSMENT AND PLAN:  Eduardo Ashley is a 89 y.o. male  with a past medical history of mild to moderate diffuse coronary artery disease (LHC 12/16/2023), hypertension,  hyperlipidemia, carotid artery disease high-grade AV block s/p Micra leadless pacemaker (11/2023), paroxysmal atrial fibrillation/flutter (on Eliquis ), severe aortic stenosis who presented to the ED on 12/23/2023 for worsening generalized weakness and black tarry stools for the  past week.  Upon admission hemoglobin 4.2.  Patient with severe aortic stenosis and in process of workup with Dr. Katina as outpatient. Of note Dr. Katina sent orders to [facility] for urgent CT TAVR on 12/15/2023.  Cardiology was consulted for further evaluation patient severe aortic stenosis.  # Acute blood loss anemia Hgb 4.2 on admission, received 4X blood transfusions and IV iron . Hgb 9.3 this AM.  - Gastroenterology consulted, no plan to perform endoscopy due to patients severe AS.  - Monitor H&H closely. - Recommend Hgb > 8 with cardiac history.   # Severe aortic stenosis Patient with recent LHC on 09/03 for aortic stenosis workup.  CT TAVR scheduled for 9/12 at [facility]. Discussed case with Dr. Katina (09/12) and recommends to attempt to have CT TAVR imaging done as an inpatient at [facility]. If [facility] unable to perform, then stabilize Hgb and reschedule CT TAVR imaging as outpatient next week. -CT TAVR imaging unable to be performed as inpatient at [facility]. Plan to scheduled CT TAVR as outpatient at either [facility] or [facility]. Will ensure images get sent to [facility] for Dr. Katina to review if scan done at [facility].   # Paroxysmal atrial fibrillation/flutter # High-grade AV block s/p Micra placed pacemaker (11/2023) - Home Eliquis  held due to severe acute anemia requiring 4x blood transfusions.  Recommend resuming when hemoglobin stabilizes, no evidence of active bleeding and when GI clears patient to resume. Can consider holding on DC and following up labs outpatient and resuming eliquis  in 1-2 weeks. -Continue metoprolol  succinate 25 mg daily.  # Coronary artery disease # Hypertension # Hyperlipidemia # Demand ischemia, setting of acute severe  anemia Patient without chest pain, SOB. Recent LHC 12/16/2023 with mild to moderate diffuse CAD.  -Home aspirin  held due to severe anemia requiring multiple blood transfusions and IV iron . -Continue home pravastatin  40 mg daily. -Continue home metoprolol  succinate as stated above. -Home irbesartan  300 mg daily held due to borderline BP. Consider resuming if this improves and remains stable.  Follow-up with Dr. Wilburn on 09/23 at 9:45 AM   This patient's plan of care was discussed and created with Dr. Custovic and she is in agreement.  Signed: Dorene Comfort, PA-C  12/29/2023, 1:16 PM [facility] Cardiology

## 2023-12-29 NOTE — Discharge Instructions (Addendum)
 The cardiology team will call you when they have set up the ct scan Monitor for signs of bleeding Check hemoglobin with follow up appointment

## 2023-12-29 NOTE — Progress Notes (Signed)
 Mobility Specialist Progress Note:    12/29/23 0950  Mobility  Activity Ambulated with assistance  Level of Assistance Standby assist, set-up cues, supervision of patient - no hands on  Assistive Device Front wheel walker  Distance Ambulated (ft) 500 ft  Range of Motion/Exercises Active;All extremities  Activity Response Tolerated well  Mobility visit 1 Mobility  Mobility Specialist Start Time (ACUTE ONLY) 0933  Mobility Specialist Stop Time (ACUTE ONLY) 0950  Mobility Specialist Time Calculation (min) (ACUTE ONLY) 17 min   Pt received in bed, eager for mobility. Required supervision to stand and ambulate with RW. Tolerated well, c/o lower back soreness. Returned supine, brother at bedside. All needs met.  Sherrilee Ditty Mobility Specialist Please contact via Special educational needs teacher or  Rehab office at [phone]

## 2023-12-29 NOTE — TOC Transition Note (Signed)
 Transition of Care [facility]) - Discharge Note   Patient Details  Name: Eduardo Ashley MRN: [id number] Date of Birth: 06/03/1930  [facility] CM/SW Contact:  Racheal LITTIE Schimke, RN Phone Number: 12/29/2023, 1:15 PM   Clinical Narrative: Spoke with patient about HHPT recommendation, he consents, no preference. HHRN/PT/Aide arranged with Arlina Mort            Patient Goals and CMS Choice            Discharge Placement                       Discharge Plan and Services Additional resources added to the After Visit Summary for                                       Social Drivers of Health (SDOH) Interventions SDOH Screenings   Food Insecurity: No Food Insecurity (12/23/2023)  Housing: Low Risk  (12/23/2023)  Transportation Needs: No Transportation Needs (12/23/2023)  Utilities: Not At Risk (12/23/2023)  Financial Resource Strain: Low Risk  (02/13/2023)   Received from [facility]  Social Connections: Socially Isolated (12/23/2023)  Tobacco Use: Medium Risk (12/23/2023)     Readmission Risk Interventions     No data to display

## 2023-12-29 NOTE — Plan of Care (Signed)
   Problem: Education: Goal: Ability to describe self-care measures that may prevent or decrease complications (Diabetes Survival Skills Education) will improve Outcome: Progressing Goal: Individualized Educational Video(s) Outcome: Progressing

## 2023-12-29 NOTE — Care Management Important Message (Signed)
 Important Message  Patient Details  Name: Eduardo Ashley MRN: [id number] Date of Birth: 1931-03-23   Important Message Given:  Completed IMM on 12/28/23, didn't document      [name] 12/29/2023, 11:23 AM

## 2023-12-29 NOTE — Care Management Important Message (Signed)
 Important Message  Patient Details  Name: Eduardo Ashley MRN: [id number] Date of Birth: 12-23-1930   Important Message Given:  Yes - Medicare IM     [name] 12/29/2023, 11:24 AM

## 2023-12-29 NOTE — Discharge Summary (Signed)
 Physician Discharge Summary   Patient: Eduardo Ashley MRN: [id number] DOB: 03-14-1931  Admit date:     12/23/2023  Discharge date: 12/29/23  Discharge Physician: Charlie Patterson   PCP: Don Lauraine Collar, NP   Recommendations at discharge:   Follow-up PCP 5 days Follow-up cardiology 1 week Will need outpatient CT scan for his aortic valve.  Discharge Diagnoses: Principal Problem:   Acute blood loss anemia Active Problems:   Severe aortic stenosis   Atrial flutter (HCC)   Complete heart block (HCC)   Essential hypertension   Coronary artery disease   Hyperlipidemia, unspecified   Uncontrolled type 2 diabetes mellitus with hyperglycemia, without long-term current use of insulin  (HCC)   BPH (benign prostatic hyperplasia)    Hospital Course: 89 y.o. 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      He is presenting today for evaluation of worsening weakness and black tarry stools for the past week or so.  Denies abdominal pain, nausea, vomiting, diarrhea, and syncope.  He has had persistent dizziness with standing which is not new. No history of GI bleeding   In the emergency department, his initial blood pressure was soft but fluid responsive.  Hemoglobin 4.2, down from 9.8 less than a month ago.  Hemoccult positive.  Received transfusion during my encounter. Will admit to hospitalist service with plans for upper and lower endoscopy with possible video capsule endoscopy Friday.   9/11.  Patient transfused 2 units of packed red blood cells and hemoglobin came up to 6.0.  Will give another unit of packed red blood cells today and IV iron  a little later on. 9/12.  Hemoglobin 7.8 this morning.  Has  not had a bowel movement since coming in.  Patient feeling okay.  Will ambulate.   9/13.  Hemoglobin 7.5.  Will give another unit of blood and IV iron  today.  Had a black bowel movement in the afternoon. 9/14.  Hemoglobin 9.0.  Monitor for bowel movements. 9/15.  Hemoglobin 8.9.  Had another black bowel movement this morning.  Continue to monitor 9/16.  Patient had large dark brown bowel movement.  Hemoglobin 9.3.  Patient feels well.  Will discharge home.  Will hold off on blood thinners at this point.  Risk of stroke is higher being off blood thinner but patient did have a major bleed.   Assessment and Plan: * Acute blood loss anemia Hemoglobin 4.2 on admission.  Patient received 4 units of packed red blood cells and 2 IV iron  during the hospital course so far.  No procedure secondary to severe aortic stenosis.  Last hemoglobin 9.3.  Patient had a large dark brown bowel movement today.  Hesitant on starting blood thinner.  Will discharge home with close follow-up.  Will need repeat hemoglobin as outpatient appointment.  Severe aortic stenosis Case discussed with physician at [facility] and recommended nonemergent replacement of valve.  They recommended continuing to watch here at [facility].  Case discussed with [facility] clinic cardiology and they will set up for the CT of the aortic valve as outpatient.  Atrial flutter (HCC) EKG shows paced rhythm 65 bpm.  Stroke risk higher off anticoagulation.  CHA2DS2-VASc score 5.  Continue Toprol .  Holding off on blood thinner at this point since patient had major bleed.  Complete heart block [facility]) Patient has pacemaker.  Essential hypertension Continue Toprol -XL  Hyperlipidemia, unspecified On pravastatin   BPH (benign prostatic hyperplasia) On Flomax   Uncontrolled type 2 diabetes mellitus with hyperglycemia, without long-term current use of insulin  (HCC) Sugars elevated.  Hemoglobin A1c 7.4.  Can go back on oral medications as  outpatient         Consultants: Gastroenterology, cardiology Procedures performed: None secondary to severe aortic stenosis Disposition: Home health Diet recommendation:  Cardiac and Carb modified diet DISCHARGE MEDICATION: Allergies as of 12/29/2023       Reactions   Hydrochlorothiazide    Muscle cramps   Lisinopril Cough   Lovastatin    Muscle pain        Medication List     STOP taking these medications    aspirin  EC 81 MG tablet   Eliquis  5 MG Tabs tablet Generic drug: apixaban    irbesartan  300 MG tablet Commonly known as: AVAPRO    VITAMIN C PO       TAKE these medications    acetaminophen  500 MG tablet Commonly known as: TYLENOL  Take 1,000 mg by mouth every 6 (six) hours as needed for moderate pain or headache.   cholecalciferol 25 MCG (1000 UNIT) tablet Commonly known as: VITAMIN D3 Take 1,000 Units by mouth daily.   feeding supplement (GLUCERNA SHAKE) Liqd Take 237 mLs by mouth 3 (three) times daily between meals.   finasteride 5 MG tablet Commonly known as: PROSCAR Take 5 mg by mouth daily.   gabapentin  600 MG tablet Commonly known as: NEURONTIN  Take 600-1,200 mg by mouth See admin instructions. Take 600 mg by mouth in the morning and take 1200 mg by mouth at bedtime   glimepiride 2 MG tablet Commonly known as: AMARYL Take 2-4 mg by mouth See admin instructions. Take 4 mg at breakfast and 2 mg at dinner   magnesium  oxide 400 MG tablet Commonly known as: MAG-OX Take 400 mg by mouth daily.   metFORMIN 1000 MG tablet Commonly known as: GLUCOPHAGE Take 1,000 mg by mouth 2 (two) times daily with a meal.   metoprolol  succinate 25 MG 24 hr tablet Commonly known as: TOPROL -XL Take 25 mg by mouth daily.   pantoprazole  40 MG tablet Commonly known as: Protonix  Take 1 tablet (40 mg total) by mouth daily.   polyethylene glycol 17 g packet Commonly known as: MIRALAX  / GLYCOLAX  Take 17 g by mouth daily as needed for moderate  constipation.   pravastatin  40 MG tablet Commonly known as: PRAVACHOL  Take 40 mg by mouth at bedtime.   sitaGLIPtin 100 MG tablet Commonly known as: JANUVIA Take 100 mg by mouth daily.   tamsulosin  0.4 MG Caps capsule Commonly known as: FLOMAX  Take 0.8 mg by mouth at bedtime.   vitamin B-12 500 MCG tablet Commonly known as: CYANOCOBALAMIN Take 500 mcg by mouth daily.        Follow-up Information     Alluri, Keller BROCKS, MD. Go in 1 week(s).   Specialty: Cardiology Why: 09/23 at 9:45 AM Contact information: [address] [place] [address] [phone]         Don Lauraine Collar, NP Follow up in 5 day(s).   Specialty: Internal Medicine Contact information: [address] [place] [address] [phone]                Discharge Exam: Filed Weights   12/23/23 1056  Weight: 81.6 kg   Physical Exam HENT:     Head: Normocephalic.  Eyes:     General: Lids are normal.  Cardiovascular:     Rate and Rhythm: Normal rate and regular rhythm.     Heart sounds: S1 normal and S2 normal. Murmur heard.     Systolic murmur is present with a grade of 3/6.  Pulmonary:     Breath sounds: No decreased breath sounds, wheezing, rhonchi or rales.  Abdominal:     Palpations: Abdomen is soft.     Tenderness: There is no abdominal tenderness.  Musculoskeletal:     Right lower leg: No swelling.     Left lower leg: No swelling.  Skin:    General: Skin is warm.     Findings: No rash.  Neurological:     Mental Status: He is alert and oriented to person, place, and time.      Condition at discharge: fair  The results of significant diagnostics from this hospitalization (including imaging, microbiology, ancillary and laboratory) are listed below for reference.   Imaging Studies: DG Chest Portable 1 View Result Date: 12/23/2023 CLINICAL DATA:  Weakness EXAM: PORTABLE CHEST 1 VIEW COMPARISON:  11/30/2023 FINDINGS: The list pacer noted. Atherosclerotic  calcification of the aortic arch. Mild enlargement of the cardiopericardial silhouette Mild scarring peripherally at the left lung base. Mildly low lung volumes. Lower thoracic spondylosis. IMPRESSION: 1. Mild enlargement of the cardiopericardial silhouette, without edema. 2. Mild scarring peripherally at the left lung base. 3. Lower thoracic spondylosis. Electronically Signed   By: Ryan Salvage M.D.   On: 12/23/2023 13:05   CARDIAC CATHETERIZATION Result Date: 12/16/2023   Ost LAD to Prox LAD lesion is 10% stenosed.   Prox LAD lesion is 45% stenosed.   Ost Cx lesion is 55% stenosed.   Ost RPDA to RPDA lesion is 40% stenosed. 1.  Mild to moderate diffuse CAD 2.  Continue workup for aortic valve replacement   CT Cervical Spine Wo Contrast Result Date: 11/30/2023 CLINICAL DATA:  Clemens, near syncope, recent pacemaker placement EXAM: CT CERVICAL SPINE WITHOUT CONTRAST TECHNIQUE: Multidetector CT imaging of the cervical spine was performed without intravenous contrast. Multiplanar CT image reconstructions were also generated. RADIATION DOSE REDUCTION: This exam was performed according to the departmental dose-optimization program which includes automated exposure control, adjustment of the mA and/or kV according to patient size and/or use of iterative reconstruction technique. COMPARISON:  12/03/2022 FINDINGS: Alignment: Alignment is anatomic. Skull base and vertebrae: No acute fracture. No primary bone lesion or focal pathologic process. Soft tissues and spinal canal: No prevertebral fluid or swelling. No visible canal hematoma. Marked atherosclerosis at the carotid bifurcations. Disc levels: Disc spaces are well preserved. Mild facet hypertrophic changes on the left at C2-3 and C3-4. Upper chest: Airway is patent. Visualized portions of the lung apices are clear. Other: Reconstructed images demonstrate no additional findings. IMPRESSION: 1. No acute cervical spine fracture. Electronically Signed   By: Ozell Daring M.D.   On: 11/30/2023 17:19   CT Head Wo Contrast Result Date: 11/30/2023 CLINICAL DATA:  Pacemaker placement on Friday, near syncopal episode, orthostatic hypotension EXAM: CT HEAD WITHOUT CONTRAST TECHNIQUE: Contiguous axial images were obtained from the base of the skull through the vertex without intravenous contrast. RADIATION DOSE REDUCTION: This exam was performed according to the departmental dose-optimization program which includes automated exposure control, adjustment of the mA and/or kV according to patient size and/or use of iterative reconstruction technique. COMPARISON:  None Available. FINDINGS: Brain: No acute infarct or hemorrhage. Focal hypodensity within the right basal ganglia consistent  with chronic lacunar infarct. Lateral ventricles and remaining midline structures are unremarkable. No acute extra-axial fluid collections. No mass effect. Vascular: No hyperdense vessel or unexpected calcification. Skull: Normal. Negative for fracture or focal lesion. Sinuses/Orbits: No acute finding. Other: None. IMPRESSION: 1. No acute intracranial process. Electronically Signed   By: Ozell Daring M.D.   On: 11/30/2023 17:17   DG Chest 2 View Result Date: 11/30/2023 CLINICAL DATA:  Recent pacemaker.  Lightheadedness. EXAM: CHEST - 2 VIEW COMPARISON:  None Available. FINDINGS: The heart size and mediastinal contours are within normal limits. There is atelectasis in the left lung base. The lungs are otherwise clear. There is no pleural effusion or pneumothorax. The visualized skeletal structures are unremarkable. IMPRESSION: Left basilar atelectasis. Electronically Signed   By: Greig Pique M.D.   On: 11/30/2023 16:05     Labs: CBC: Recent Labs  Lab 12/23/23 1104 12/24/23 0255 12/24/23 1950 12/25/23 0803 12/26/23 0431 12/27/23 0621 12/28/23 0318 12/29/23 0316  WBC 3.2* 3.7*  --  3.2* 3.5*  --  4.7  --   HGB 4.2* 6.0* 7.5* 7.8* 7.5* 9.0* 8.9* 9.3*  HCT 12.9* 17.9* 22.1* 23.5*  23.1*  --  27.2*  --   MCV 92.1 90.4  --  91.4 92.0  --  92.5  --   PLT 131* 118*  --  132* 131*  --  133*  --    Basic Metabolic Panel: Recent Labs  Lab 12/23/23 1104 12/24/23 0255 12/28/23 0318  NA 134* 137 138  K 4.5 3.9 4.0  CL 104 107 104  CO2 25 24 27   GLUCOSE 320* 171* 132*  BUN 38* 35* 21  CREATININE 1.06 1.00 1.00  CALCIUM 8.2* 8.3* 8.5*   Liver Function Tests: Recent Labs  Lab 12/23/23 1104  AST 12*  ALT 8  ALKPHOS 43  BILITOT 0.5  PROT 4.2*  ALBUMIN 2.6*   CBG: Recent Labs  Lab 12/28/23 1200 12/28/23 1628 12/28/23 2059 12/29/23 0750 12/29/23 1207  GLUCAP 199* 230* 200* 102* 177*    Discharge time spent: greater than 30 minutes.  Signed: Charlie Patterson, MD [facility] 12/29/2023

## 2023-12-29 NOTE — Plan of Care (Signed)

## 2024-01-04 DIAGNOSIS — I4892 Unspecified atrial flutter: Secondary | ICD-10-CM | POA: Diagnosis not present

## 2024-01-04 DIAGNOSIS — Z9289 Personal history of other medical treatment: Secondary | ICD-10-CM | POA: Diagnosis not present

## 2024-01-04 DIAGNOSIS — E119 Type 2 diabetes mellitus without complications: Secondary | ICD-10-CM | POA: Diagnosis not present

## 2024-01-04 DIAGNOSIS — D62 Acute posthemorrhagic anemia: Secondary | ICD-10-CM | POA: Diagnosis not present

## 2024-01-04 DIAGNOSIS — Z79899 Other long term (current) drug therapy: Secondary | ICD-10-CM | POA: Diagnosis not present

## 2024-01-04 DIAGNOSIS — Z1331 Encounter for screening for depression: Secondary | ICD-10-CM | POA: Diagnosis not present

## 2024-01-04 DIAGNOSIS — I350 Nonrheumatic aortic (valve) stenosis: Secondary | ICD-10-CM | POA: Diagnosis not present

## 2024-01-04 DIAGNOSIS — Z09 Encounter for follow-up examination after completed treatment for conditions other than malignant neoplasm: Secondary | ICD-10-CM | POA: Diagnosis not present

## 2024-01-05 DIAGNOSIS — Z8719 Personal history of other diseases of the digestive system: Secondary | ICD-10-CM | POA: Diagnosis not present

## 2024-01-05 DIAGNOSIS — Z950 Presence of cardiac pacemaker: Secondary | ICD-10-CM | POA: Diagnosis not present

## 2024-01-05 DIAGNOSIS — I2510 Atherosclerotic heart disease of native coronary artery without angina pectoris: Secondary | ICD-10-CM | POA: Diagnosis not present

## 2024-01-05 DIAGNOSIS — I2584 Coronary atherosclerosis due to calcified coronary lesion: Secondary | ICD-10-CM | POA: Diagnosis not present

## 2024-01-05 DIAGNOSIS — I10 Essential (primary) hypertension: Secondary | ICD-10-CM | POA: Diagnosis not present

## 2024-01-05 DIAGNOSIS — R55 Syncope and collapse: Secondary | ICD-10-CM | POA: Diagnosis not present

## 2024-01-05 DIAGNOSIS — I350 Nonrheumatic aortic (valve) stenosis: Secondary | ICD-10-CM | POA: Diagnosis not present

## 2024-01-05 DIAGNOSIS — I442 Atrioventricular block, complete: Secondary | ICD-10-CM | POA: Diagnosis not present

## 2024-01-11 DIAGNOSIS — I10 Essential (primary) hypertension: Secondary | ICD-10-CM | POA: Diagnosis not present

## 2024-01-11 DIAGNOSIS — Z8719 Personal history of other diseases of the digestive system: Secondary | ICD-10-CM | POA: Diagnosis not present

## 2024-01-12 DIAGNOSIS — Z01810 Encounter for preprocedural cardiovascular examination: Secondary | ICD-10-CM | POA: Diagnosis not present

## 2024-01-12 DIAGNOSIS — I2510 Atherosclerotic heart disease of native coronary artery without angina pectoris: Secondary | ICD-10-CM | POA: Diagnosis not present

## 2024-01-12 DIAGNOSIS — I700 Atherosclerosis of aorta: Secondary | ICD-10-CM | POA: Diagnosis not present

## 2024-01-12 DIAGNOSIS — K8020 Calculus of gallbladder without cholecystitis without obstruction: Secondary | ICD-10-CM | POA: Diagnosis not present

## 2024-01-12 DIAGNOSIS — I350 Nonrheumatic aortic (valve) stenosis: Secondary | ICD-10-CM | POA: Diagnosis not present

## 2024-01-18 DIAGNOSIS — Z4682 Encounter for fitting and adjustment of non-vascular catheter: Secondary | ICD-10-CM | POA: Diagnosis not present

## 2024-01-18 DIAGNOSIS — J986 Disorders of diaphragm: Secondary | ICD-10-CM | POA: Diagnosis not present

## 2024-01-18 DIAGNOSIS — J90 Pleural effusion, not elsewhere classified: Secondary | ICD-10-CM | POA: Diagnosis not present

## 2024-01-18 DIAGNOSIS — I4892 Unspecified atrial flutter: Secondary | ICD-10-CM | POA: Diagnosis not present

## 2024-01-18 DIAGNOSIS — I441 Atrioventricular block, second degree: Secondary | ICD-10-CM | POA: Diagnosis not present

## 2024-01-18 DIAGNOSIS — I2510 Atherosclerotic heart disease of native coronary artery without angina pectoris: Secondary | ICD-10-CM | POA: Diagnosis not present

## 2024-01-18 DIAGNOSIS — E119 Type 2 diabetes mellitus without complications: Secondary | ICD-10-CM | POA: Diagnosis not present

## 2024-01-18 DIAGNOSIS — E1159 Type 2 diabetes mellitus with other circulatory complications: Secondary | ICD-10-CM | POA: Diagnosis not present

## 2024-01-18 DIAGNOSIS — I10 Essential (primary) hypertension: Secondary | ICD-10-CM | POA: Diagnosis not present

## 2024-01-18 DIAGNOSIS — Z950 Presence of cardiac pacemaker: Secondary | ICD-10-CM | POA: Diagnosis not present

## 2024-01-18 DIAGNOSIS — I25119 Atherosclerotic heart disease of native coronary artery with unspecified angina pectoris: Secondary | ICD-10-CM | POA: Diagnosis not present

## 2024-01-18 DIAGNOSIS — Z01818 Encounter for other preprocedural examination: Secondary | ICD-10-CM | POA: Diagnosis not present

## 2024-01-18 DIAGNOSIS — Z955 Presence of coronary angioplasty implant and graft: Secondary | ICD-10-CM | POA: Diagnosis not present

## 2024-01-18 DIAGNOSIS — Z006 Encounter for examination for normal comparison and control in clinical research program: Secondary | ICD-10-CM | POA: Diagnosis not present

## 2024-01-18 DIAGNOSIS — R931 Abnormal findings on diagnostic imaging of heart and coronary circulation: Secondary | ICD-10-CM | POA: Diagnosis not present

## 2024-01-18 DIAGNOSIS — J9811 Atelectasis: Secondary | ICD-10-CM | POA: Diagnosis not present

## 2024-01-18 DIAGNOSIS — Z452 Encounter for adjustment and management of vascular access device: Secondary | ICD-10-CM | POA: Diagnosis not present

## 2024-01-18 DIAGNOSIS — E1142 Type 2 diabetes mellitus with diabetic polyneuropathy: Secondary | ICD-10-CM | POA: Diagnosis not present

## 2024-01-18 DIAGNOSIS — I6523 Occlusion and stenosis of bilateral carotid arteries: Secondary | ICD-10-CM | POA: Diagnosis not present

## 2024-01-18 DIAGNOSIS — I442 Atrioventricular block, complete: Secondary | ICD-10-CM | POA: Diagnosis not present

## 2024-01-18 DIAGNOSIS — Z952 Presence of prosthetic heart valve: Secondary | ICD-10-CM | POA: Diagnosis not present

## 2024-01-18 DIAGNOSIS — D61818 Other pancytopenia: Secondary | ICD-10-CM | POA: Diagnosis not present

## 2024-01-18 DIAGNOSIS — E11319 Type 2 diabetes mellitus with unspecified diabetic retinopathy without macular edema: Secondary | ICD-10-CM | POA: Diagnosis not present

## 2024-01-18 DIAGNOSIS — I350 Nonrheumatic aortic (valve) stenosis: Secondary | ICD-10-CM | POA: Diagnosis not present

## 2024-01-18 DIAGNOSIS — R918 Other nonspecific abnormal finding of lung field: Secondary | ICD-10-CM | POA: Diagnosis not present

## 2024-01-19 DIAGNOSIS — Z452 Encounter for adjustment and management of vascular access device: Secondary | ICD-10-CM | POA: Diagnosis not present

## 2024-01-19 DIAGNOSIS — Z4682 Encounter for fitting and adjustment of non-vascular catheter: Secondary | ICD-10-CM | POA: Diagnosis not present

## 2024-01-19 DIAGNOSIS — Z952 Presence of prosthetic heart valve: Secondary | ICD-10-CM | POA: Diagnosis not present

## 2024-01-19 DIAGNOSIS — J90 Pleural effusion, not elsewhere classified: Secondary | ICD-10-CM | POA: Diagnosis not present

## 2024-01-19 DIAGNOSIS — I442 Atrioventricular block, complete: Secondary | ICD-10-CM | POA: Diagnosis not present

## 2024-01-19 DIAGNOSIS — R918 Other nonspecific abnormal finding of lung field: Secondary | ICD-10-CM | POA: Diagnosis not present

## 2024-01-19 DIAGNOSIS — R931 Abnormal findings on diagnostic imaging of heart and coronary circulation: Secondary | ICD-10-CM | POA: Diagnosis not present

## 2024-01-20 DIAGNOSIS — Z4682 Encounter for fitting and adjustment of non-vascular catheter: Secondary | ICD-10-CM | POA: Diagnosis not present

## 2024-01-20 DIAGNOSIS — Z952 Presence of prosthetic heart valve: Secondary | ICD-10-CM | POA: Diagnosis not present

## 2024-01-20 DIAGNOSIS — Z452 Encounter for adjustment and management of vascular access device: Secondary | ICD-10-CM | POA: Diagnosis not present

## 2024-01-20 DIAGNOSIS — J9811 Atelectasis: Secondary | ICD-10-CM | POA: Diagnosis not present

## 2024-01-20 DIAGNOSIS — J986 Disorders of diaphragm: Secondary | ICD-10-CM | POA: Diagnosis not present

## 2024-01-20 DIAGNOSIS — J90 Pleural effusion, not elsewhere classified: Secondary | ICD-10-CM | POA: Diagnosis not present

## 2024-01-22 DIAGNOSIS — Z0130 Encounter for examination of blood pressure without abnormal findings: Secondary | ICD-10-CM | POA: Diagnosis not present

## 2024-02-02 DIAGNOSIS — I4892 Unspecified atrial flutter: Secondary | ICD-10-CM | POA: Diagnosis not present

## 2024-02-02 DIAGNOSIS — I2510 Atherosclerotic heart disease of native coronary artery without angina pectoris: Secondary | ICD-10-CM | POA: Diagnosis not present

## 2024-02-02 DIAGNOSIS — Z952 Presence of prosthetic heart valve: Secondary | ICD-10-CM | POA: Diagnosis not present

## 2024-02-02 DIAGNOSIS — Z950 Presence of cardiac pacemaker: Secondary | ICD-10-CM | POA: Diagnosis not present

## 2024-02-02 DIAGNOSIS — I10 Essential (primary) hypertension: Secondary | ICD-10-CM | POA: Diagnosis not present

## 2024-02-02 DIAGNOSIS — I350 Nonrheumatic aortic (valve) stenosis: Secondary | ICD-10-CM | POA: Diagnosis not present

## 2024-02-02 DIAGNOSIS — I2584 Coronary atherosclerosis due to calcified coronary lesion: Secondary | ICD-10-CM | POA: Diagnosis not present

## 2024-02-17 DIAGNOSIS — Z950 Presence of cardiac pacemaker: Secondary | ICD-10-CM | POA: Diagnosis not present

## 2024-02-18 DIAGNOSIS — Z950 Presence of cardiac pacemaker: Secondary | ICD-10-CM | POA: Diagnosis not present

## 2024-02-18 DIAGNOSIS — I152 Hypertension secondary to endocrine disorders: Secondary | ICD-10-CM | POA: Diagnosis not present

## 2024-02-18 DIAGNOSIS — N401 Enlarged prostate with lower urinary tract symptoms: Secondary | ICD-10-CM | POA: Diagnosis not present

## 2024-02-18 DIAGNOSIS — I25119 Atherosclerotic heart disease of native coronary artery with unspecified angina pectoris: Secondary | ICD-10-CM | POA: Diagnosis not present

## 2024-02-18 DIAGNOSIS — Z23 Encounter for immunization: Secondary | ICD-10-CM | POA: Diagnosis not present

## 2024-02-18 DIAGNOSIS — Z0000 Encounter for general adult medical examination without abnormal findings: Secondary | ICD-10-CM | POA: Diagnosis not present

## 2024-02-18 DIAGNOSIS — I470 Re-entry ventricular arrhythmia: Secondary | ICD-10-CM | POA: Diagnosis not present

## 2024-02-18 DIAGNOSIS — R9431 Abnormal electrocardiogram [ECG] [EKG]: Secondary | ICD-10-CM | POA: Diagnosis not present

## 2024-02-18 DIAGNOSIS — E1159 Type 2 diabetes mellitus with other circulatory complications: Secondary | ICD-10-CM | POA: Diagnosis not present

## 2024-02-18 DIAGNOSIS — I2510 Atherosclerotic heart disease of native coronary artery without angina pectoris: Secondary | ICD-10-CM | POA: Diagnosis not present

## 2024-02-18 DIAGNOSIS — E1142 Type 2 diabetes mellitus with diabetic polyneuropathy: Secondary | ICD-10-CM | POA: Diagnosis not present

## 2024-02-18 DIAGNOSIS — Z1331 Encounter for screening for depression: Secondary | ICD-10-CM | POA: Diagnosis not present

## 2024-02-18 DIAGNOSIS — I442 Atrioventricular block, complete: Secondary | ICD-10-CM | POA: Diagnosis not present

## 2024-02-18 DIAGNOSIS — Z952 Presence of prosthetic heart valve: Secondary | ICD-10-CM | POA: Diagnosis not present

## 2024-02-18 DIAGNOSIS — R351 Nocturia: Secondary | ICD-10-CM | POA: Diagnosis not present

## 2024-02-18 DIAGNOSIS — E7800 Pure hypercholesterolemia, unspecified: Secondary | ICD-10-CM | POA: Diagnosis not present

## 2024-02-18 DIAGNOSIS — I350 Nonrheumatic aortic (valve) stenosis: Secondary | ICD-10-CM | POA: Diagnosis not present

## 2024-02-18 DIAGNOSIS — R918 Other nonspecific abnormal finding of lung field: Secondary | ICD-10-CM | POA: Diagnosis not present

## 2024-02-18 DIAGNOSIS — I10 Essential (primary) hypertension: Secondary | ICD-10-CM | POA: Diagnosis not present

## 2024-02-19 DIAGNOSIS — Z952 Presence of prosthetic heart valve: Secondary | ICD-10-CM | POA: Diagnosis not present

## 2024-02-19 DIAGNOSIS — Z48812 Encounter for surgical aftercare following surgery on the circulatory system: Secondary | ICD-10-CM | POA: Diagnosis not present

## 2024-03-01 DIAGNOSIS — I442 Atrioventricular block, complete: Secondary | ICD-10-CM | POA: Diagnosis not present

## 2024-03-01 DIAGNOSIS — Z952 Presence of prosthetic heart valve: Secondary | ICD-10-CM | POA: Diagnosis not present

## 2024-03-01 DIAGNOSIS — I10 Essential (primary) hypertension: Secondary | ICD-10-CM | POA: Diagnosis not present
